# Patient Record
Sex: Female | Born: 1990 | Hispanic: Yes | Marital: Single | Smoking: Never smoker
Health system: Southern US, Community
[De-identification: ages and names within clinical notes are randomized; demographics above are authoritative.]

## PROBLEM LIST (undated history)

## (undated) ENCOUNTER — Inpatient Hospital Stay (HOSPITAL_COMMUNITY): Payer: Self-pay

## (undated) DIAGNOSIS — E781 Pure hyperglyceridemia: Secondary | ICD-10-CM

## (undated) DIAGNOSIS — E119 Type 2 diabetes mellitus without complications: Secondary | ICD-10-CM

## (undated) DIAGNOSIS — N994 Postprocedural pelvic peritoneal adhesions: Secondary | ICD-10-CM

## (undated) DIAGNOSIS — Z789 Other specified health status: Secondary | ICD-10-CM

## (undated) DIAGNOSIS — E785 Hyperlipidemia, unspecified: Secondary | ICD-10-CM

---

## 2014-02-08 ENCOUNTER — Encounter (HOSPITAL_COMMUNITY): Payer: Self-pay | Admitting: *Deleted

## 2014-02-08 ENCOUNTER — Inpatient Hospital Stay (HOSPITAL_COMMUNITY)
Admission: AD | Admit: 2014-02-08 | Discharge: 2014-02-08 | Disposition: A | Discharge status: Home / Self Care | Payer: Self-pay | Source: Ambulatory Visit | Attending: Obstetrics & Gynecology | Admitting: Obstetrics & Gynecology

## 2014-02-08 DIAGNOSIS — O99810 Abnormal glucose complicating pregnancy: Secondary | ICD-10-CM

## 2014-02-08 DIAGNOSIS — Z3493 Encounter for supervision of normal pregnancy, unspecified, third trimester: Secondary | ICD-10-CM

## 2014-02-08 DIAGNOSIS — O9989 Other specified diseases and conditions complicating pregnancy, childbirth and the puerperium: Secondary | ICD-10-CM | POA: Insufficient documentation

## 2014-02-08 DIAGNOSIS — Z98891 History of uterine scar from previous surgery: Secondary | ICD-10-CM

## 2014-02-08 DIAGNOSIS — O0933 Supervision of pregnancy with insufficient antenatal care, third trimester: Secondary | ICD-10-CM

## 2014-02-08 DIAGNOSIS — Z3A33 33 weeks gestation of pregnancy: Secondary | ICD-10-CM | POA: Insufficient documentation

## 2014-02-08 DIAGNOSIS — Z331 Pregnant state, incidental: Secondary | ICD-10-CM

## 2014-02-08 HISTORY — DX: Other specified health status: Z78.9

## 2014-02-08 HISTORY — DX: History of uterine scar from previous surgery: Z98.891

## 2014-02-08 LAB — COMPREHENSIVE METABOLIC PANEL
ALT: 9 U/L (ref 0–35)
AST: 13 U/L (ref 0–37)
Albumin: 2.7 g/dL — ABNORMAL LOW (ref 3.5–5.2)
Alkaline Phosphatase: 97 U/L (ref 39–117)
Anion gap: 11 (ref 5–15)
BILIRUBIN TOTAL: 0.3 mg/dL (ref 0.3–1.2)
BUN: 4 mg/dL — ABNORMAL LOW (ref 6–23)
CHLORIDE: 106 meq/L (ref 96–112)
CO2: 20 meq/L (ref 19–32)
CREATININE: 0.42 mg/dL — AB (ref 0.50–1.10)
Calcium: 8.7 mg/dL (ref 8.4–10.5)
GFR calc Af Amer: >90 mL/min (ref >90)
GFR calc non Af Amer: >90 mL/min (ref >90)
Glucose, Bld: 84 mg/dL (ref 70–99)
POTASSIUM: 3.5 meq/L — AB (ref 3.7–5.3)
SODIUM: 137 meq/L (ref 137–147)
Total Protein: 6.5 g/dL (ref 6.0–8.3)

## 2014-02-08 LAB — HIV ANTIBODY (ROUTINE TESTING W REFLEX): HIV: NONREACTIVE

## 2014-02-08 LAB — CBC
HEMATOCRIT: 31.7 % — AB (ref 36.0–46.0)
Hemoglobin: 10.5 g/dL — ABNORMAL LOW (ref 12.0–15.0)
MCH: 27.7 pg (ref 26.0–34.0)
MCHC: 33.1 g/dL (ref 30.0–36.0)
MCV: 83.6 fL (ref 78.0–100.0)
Platelets: 169 10*3/uL (ref 150–400)
RBC: 3.79 MIL/uL — ABNORMAL LOW (ref 3.87–5.11)
RDW: 13.2 % (ref 11.5–15.5)
WBC: 6.3 10*3/uL (ref 4.0–10.5)

## 2014-02-08 LAB — DIFFERENTIAL
BASOS ABS: 0 10*3/uL (ref 0.0–0.1)
Basophils Relative: 0 % (ref 0–1)
EOS PCT: 4 % (ref 0–5)
Eosinophils Absolute: 0.2 10*3/uL (ref 0.0–0.7)
LYMPHS PCT: 29 % (ref 12–46)
Lymphs Abs: 1.8 10*3/uL (ref 0.7–4.0)
MONO ABS: 0.3 10*3/uL (ref 0.1–1.0)
Monocytes Relative: 5 % (ref 3–12)
Neutro Abs: 4 10*3/uL (ref 1.7–7.7)
Neutrophils Relative %: 62 % (ref 43–77)

## 2014-02-08 LAB — ABO/RH: ABO/RH(D): O POS

## 2014-02-08 LAB — TYPE AND SCREEN
ABO/RH(D): O POS
Antibody Screen: NEGATIVE

## 2014-02-08 LAB — GLUCOSE TOLERANCE, 1 HOUR: GLUCOSE 1 HOUR GTT: 160 mg/dL — AB (ref 70–140)

## 2014-02-08 LAB — HEPATITIS B SURFACE ANTIGEN: Hepatitis B Surface Ag: NEGATIVE

## 2014-02-08 LAB — RPR

## 2014-02-08 LAB — PROTEIN / CREATININE RATIO, URINE
Creatinine, Urine: 45.22 mg/dL
Protein Creatinine Ratio: 0.17 — ABNORMAL HIGH (ref 0.00–0.15)
Total Protein, Urine: 7.8 mg/dL

## 2014-02-08 NOTE — MAU Note (Signed)
RN spoke with Lyla Sonarrie in [facility]. Patient outpatient ultrasound scheduled for Wednesday Oct 7th at 3:30.

## 2014-02-08 NOTE — Discharge Instructions (Signed)
Tercer trimestre de Media planner (Third Trimester of Pregnancy) El tercer trimestre va desde la semana29 hasta la 92, desde el sptimo hasta el noveno mes, y es la poca en la que el feto crece ms rpidamente. Hacia el final del noveno mes, el feto mide alrededor de 20pulgadas (45cm) de largo y pesa entre 6 y 68 libras (2,700 y 57,500kg).  CAMBIOS EN EL ORGANISMO Su organismo atraviesa por muchos cambios durante el [place], y estos varan de Ardelia Mems mujer a Theatre manager.   Seguir [employer]. Es de esperar que aumente entre 25 y 35libras (64 y 16kg) hacia el final del Media planner.  Podrn aparecer las primeras [employer] Corporation caderas, el abdomen y las [place].  Puede tener necesidad de Garment/textile technologist con ms frecuencia porque el feto baja hacia la pelvis y ejerce presin sobre la vejiga.  Debido al Glennis Brink podr sentir Victorio Palm estomacal con frecuencia.  Puede estar estreida, ya que ciertas hormonas enlentecen los movimientos de los msculos que [employer] desechos a travs de los intestinos.  Pueden aparecer hemorroides o abultarse e hincharse las venas (venas varicosas).  Puede sentir dolor plvico debido al [employer] y a que las hormonas del Scientist, research (life sciences) las articulaciones entre los huesos de la pelvis. El dolor de espalda puede ser consecuencia de la sobrecarga de los msculos que soportan la [place].  Tal vez haya cambios en el cabello que pueden incluir su engrosamiento, crecimiento rpido y cambios en la textura. Adems, a algunas mujeres se les cae el cabello durante o despus del embarazo, o tienen el cabello seco o fino. Lo ms probable es que el cabello se le normalice despus del nacimiento del beb.  Las [employer] seguirn creciendo y Teaching laboratory technician. A veces, puede haber una secrecin amarilla de las mamas llamada calostro.  El ombligo puede salir hacia afuera.  Puede sentir que le falta el aire debido a que se expande el tero.  Puede notar que el feto "baja" o lo siente ms bajo, en el  abdomen.  Puede tener una prdida de secrecin mucosa con sangre. Esto suele ocurrir en el trmino de unos pocos das a una semana antes de que comience el [place].  El cuello del tero se vuelve delgado y blando (se borra) cerca de la fecha de [place]. QU DEBE ESPERAR EN LOS EXMENES PRENATALES  Le harn exmenes prenatales cada 2semanas hasta la semana36. A partir de ese momento le harn exmenes semanales. Durante una visita prenatal de rutina:  La pesarn para asegurarse de que usted y el feto estn creciendo normalmente.  Le tomarn la presin arterial.  Le medirn el abdomen para controlar el desarrollo del beb.  Se escucharn los latidos cardacos fetales.  Se evaluarn los resultados de los estudios solicitados en visitas anteriores.  Le revisarn el cuello del tero cuando est prxima la fecha de parto para controlar si este se ha borrado. Alrededor de la semana36, el mdico le revisar el cuello del tero. Al mismo tiempo, realizar un anlisis de las secreciones del tejido vaginal. Este examen es para determinar si hay un tipo de bacteria, estreptococo Grupo B. El mdico le explicar esto con ms detalle. El mdico puede preguntarle lo siguiente:  Cmo le gustara que fuera el [place].  Cmo se siente.  Si siente los movimientos del beb.  Si ha tenido sntomas anormales, como prdida de lquido, [place], dolores de cabeza intensos o clicos abdominales.  Si tiene [employer]. Otros exmenes o estudios de deteccin que pueden realizarse  durante el tercer trimestre incluyen lo siguiente:  Anlisis de sangre para controlar las concentraciones de hierro (anemia).  Controles fetales para determinar su salud, nivel de [place] y Mining engineer. Si tiene [place] enfermedad o hay problemas durante el embarazo, le harn estudios. FALSO TRABAJO DE PARTO Es posible que sienta contracciones leves e irregulares que finalmente desaparecen. Se llaman contracciones de  Braxton Hicks o falso trabajo de Star. Las Yahoo pueden durar horas, das o incluso semanas, antes de que el verdadero trabajo de parto se inicie. Si las contracciones ocurren a intervalos regulares, se intensifican o se hacen dolorosas, lo mejor es que la revise el mdico.  SIGNOS DE TRABAJO DE PARTO   Clicos de tipo menstrual.  Contracciones cada 13minutos o menos.  Contracciones que comienzan en la parte superior del tero y se extienden hacia abajo, a la zona inferior del abdomen y la espalda.  Sensacin de mayor presin en la pelvis o dolor de espalda.  Una secrecin de mucosidad acuosa o con sangre que sale de la vagina. Si tiene alguno de estos signos antes de la [id number] del Media planner, llame a su mdico de inmediato. Debe concurrir al hospital para que la controlen inmediatamente. INSTRUCCIONES PARA EL CUIDADO EN EL HOGAR   Evite fumar, consumir hierbas, beber alcohol y tomar frmacos que no le hayan recetado. Estas sustancias qumicas afectan la formacin y el desarrollo del beb.  [place] mdico en relacin con el uso de medicamentos. Durante el embarazo, hay medicamentos que son seguros de tomar y otros que no.  Haga actividad fsica solo en la forma indicada por el mdico. Sentir clicos uterinos es un buen signo para Ambulance person actividad fsica.  Contine comiendo alimentos que sanos con regularidad.  Use un sostn que le brinde buen soporte si le [employer].  No se d baos de inmersin en agua caliente, baos turcos ni saunas.  Colquese el cinturn de seguridad cuando conduzca.  No coma carne cruda ni queso sin cocinar; evite el contacto con las bandejas sanitarias de los gatos y la tierra que estos animales usan. Estos elementos contienen grmenes que pueden causar defectos congnitos en el beb.  [place].  Si est estreida, pruebe un laxante suave (si el mdico lo autoriza). Consuma ms alimentos ricos en  fibra, como vegetales y frutas frescos y Psychologist, prison and probation services. Beba gran cantidad de lquido para mantener la orina de tono claro o color amarillo plido.  Dese baos de asiento con agua tibia para Best boy o las molestias causadas por las hemorroides. Use una crema para las hemorroides si el mdico la autoriza.  Si tiene venas varicosas, use medias de descanso. Eleve los pies durante 80minutos, 3 o 4veces por da. Limite la cantidad de sal en su dieta.  Evite levantar objetos pesados, use zapatos de tacones bajos y [place].  Descanse con las piernas elevadas si tiene calambres o dolor de cintura.  Visite a su dentista si no lo ha Quarry manager. Use un cepillo de dientes blando para higienizarse los dientes y psese el hilo dental con suavidad.  Puede seguir [employer], a menos que el mdico le indique lo contrario.  No haga viajes largos excepto que sea absolutamente necesario y solo con la autorizacin del [place] clases prenatales para Development worker, international aid, Psychologist, prison and probation services y hacer preguntas sobre el [place] de parto y [place].  Haga un ensayo de la partida al hospital.  Prepare el bolso que  llevar al hospital.  Prepare la habitacin del beb.  Concurra a todas las visitas prenatales segn las indicaciones de su mdico. SOLICITE ATENCIN MDICA SI:  No est segura de que est en trabajo de parto o de que ha roto la bolsa de las aguas.  Tiene mareos.  Siente clicos leves, presin en la pelvis o dolor persistente en el abdomen.  Tiene nuseas, vmitos o diarrea persistentes.  Tiene secrecin vaginal con mal olor.  Siente dolor al ConocoPhillipsorinar. SOLICITE ATENCIN MDICA DE INMEDIATO SI:   Tiene fiebre.  Tiene una prdida de lquido por la vagina.  Tiene sangrado o pequeas prdidas vaginales.  Siente dolor intenso o clicos en el abdomen.  Sube o baja de peso rpidamente.  Tiene dificultad para respirar y siente dolor de  pecho.  Sbitamente se le hinchan mucho el rostro, las [place], los tobillos, los pies o las piernas.  No ha sentido los movimientos del beb durante Georgianne Fickuna hora.  Siente un dolor de cabeza intenso que no se alivia con medicamentos.  Hay cambios en la visin. Document Released: 02/03/2005 Document Revised: 05/01/2013 [facility] Patient Information 2015 [place], [place]. This information is not intended to replace advice given to you by your health care provider. Make sure you discuss any questions you have with your health care provider.   Evaluacin de los movimientos fetales  (Fetal Movement Counts) Nombre del paciente: __________________________________________________ Shelia ChapmanFecha de parto estimada: ____________________ Shelia HammanLa evaluacin de los movimientos fetales es muy recomendable en los embarazos de alto riesgo, pero tambin es una buena idea que lo hagan todas las Grass Valleyembarazadas. El Firefightermdico le indicar que comience a contarlos a las 28 semanas de Heavenerembarazo. Los movimientos fetales suelen aumentar:   Despus de Animatoruna comida completa.  Despus de la actividad fsica.  Despus de comer o beber Graybar Electricalgo dulce o fro.  En reposo. Preste atencin cuando sienta que el beb est ms activo. Esto le ayudar a notar un patrn de ciclos de vigilia y sueo de su beb y cules son los factores que contribuyen a un aumento de los movimientos fetales. Es importante llevar a cabo un recuento de movimientos fetales, al mismo tiempo cada da, cuando el beb normalmente est ms activo.  CMO CONTAR LOS MOVIMIENTOS FETALES 1. Busque un lugar tranquilo y cmodo para sentarse o recostarse sobre el lado izquierdo. Al recostarse sobre su lado izquierdo, le proporciona una mejor circulacin de [place] Branchsangre y oxgeno al beb. 2. Anote el da y la hora en una hoja de papel o en un diario. 3. Comience contando las pataditas, revoloteos, chasquidos, vueltas o pinchazos en un perodo de 2 horas. Debe sentir al menos 10 movimientos en 2  horas. 4. Si no siente 10 movimientos en 2 horas, espere 2  3 horas y cuente de nuevo. Busque cambios en el patrn o si no cuenta lo suficiente en 2 horas. SOLICITE ATENCIN MDICA SI:   Siente menos de 10 pataditas en 2 horas, en dos intentos.  No hay movimientos durante una hora.  El patrn se modifica o le lleva ms tiempo Art gallery managercada da contar las 10 pataditas.  Siente que el beb no se mueve como lo hace habitualmente.

## 2014-02-08 NOTE — MAU Note (Signed)
Pt seen @ MD office this a.m., found out she was pregnant today - note with pt from MD states she has fundal height of 32 cm's & pos FHT's.  [facility]  Denies pain, bleeding or LOF.  Pt states she had numerous neg HPT's, then pos HPT a month ago.

## 2014-02-08 NOTE — MAU Provider Note (Signed)
History     CSN: [id number]  Arrival date and time: 02/08/14 1038   First Provider Initiated Contact with Patient 02/08/14 1131      Chief complaint: Positive pregnancy test  HPI  Idelia Caudell is a 23 y.o. G4P3 at [redacted]w[redacted]d by LMP= 06/16/2013 who presents with no prenatal care and a positive pregnancy test this morning. Patient reports initially presenting to the [facility]" where she had a positive pregnancy test and was told to come to [facility]. All of her previous pregnancies were managed in [place] all reportedly delivered via c/s due to fetal macrosomia. Denies any complications with those pregnancies. Patient denies headaches, blurred vision, and abdominal pain. No history of hypertension or diabetes.   She denies contractions, loss of fluid, and vaginal bleeding. Reports good fetal movements.   OB History   Grav Para Term Preterm Abortions TAB SAB Ect Mult Living   4 3        3       Past Medical History  Diagnosis Date  . Medical history non-contributory     Past Surgical History  Procedure Laterality Date  . Cesarean section      C/S x 3    History reviewed. No pertinent family history.  History  Substance Use Topics  . Smoking status: Never Smoker   . Smokeless tobacco: Not on file  . Alcohol Use: No    Allergies: Allergies not on file  No prescriptions prior to admission    Review of Systems  All other systems reviewed and are negative.  Physical Exam   Blood pressure 121/73, pulse 72, temperature 98.4 F (36.9 C), temperature source Oral, resp. rate 18, last menstrual period 06/16/2013.  Physical Exam  Nursing note and vitals reviewed. Constitutional: She is oriented to person, place, and time. She appears well-developed. No distress.  HENT:  Head: Normocephalic and atraumatic.  Neck: Normal range of motion.  Cardiovascular: Normal rate and regular rhythm.   No murmur heard. Respiratory: Effort normal and breath sounds normal.  She has no wheezes.  GI: Soft. There is no tenderness.  Musculoskeletal: Normal range of motion. She exhibits no edema.  Neurological: She is alert and oriented to person, place, and time. She displays normal reflexes.  Skin: Skin is warm and dry.   FHT:  FHR: 145 bpm, variability: Moderate,  accelerations:  present,  decelerations:  none  Procedures  Results for orders placed during the hospital encounter of 02/08/14 (from the past 24 hour(s))  PROTEIN / CREATININE RATIO, URINE     Status: Abnormal   Collection Time    02/08/14 10:55 AM      Result Value Ref Range   Creatinine, Urine 45.22     Total Protein, Urine 7.8     Protein Creatinine Ratio 0.17 (*) 0.00 - 0.15  CBC     Status: Abnormal   Collection Time    02/08/14 11:56 AM      Result Value Ref Range   WBC 6.3  4.0 - 10.5 K/uL   RBC 3.79 (*) 3.87 - 5.11 MIL/uL   Hemoglobin 10.5 (*) 12.0 - 15.0 g/dL   HCT 40.9 (*) 81.1 - 91.4 %   MCV 83.6  78.0 - 100.0 fL   MCH 27.7  26.0 - 34.0 pg   MCHC 33.1  30.0 - 36.0 g/dL   RDW 78.2  95.6 - 21.3 %   Platelets 169  150 - 400 K/uL  DIFFERENTIAL     Status: None  Collection Time    02/08/14 11:56 AM      Result Value Ref Range   Neutrophils Relative % 62  43 - 77 %   Neutro Abs 4.0  1.7 - 7.7 K/uL   Lymphocytes Relative 29  12 - 46 %   Lymphs Abs 1.8  0.7 - 4.0 K/uL   Monocytes Relative 5  3 - 12 %   Monocytes Absolute 0.3  0.1 - 1.0 K/uL   Eosinophils Relative 4  0 - 5 %   Eosinophils Absolute 0.2  0.0 - 0.7 K/uL   Basophils Relative 0  0 - 1 %   Basophils Absolute 0.0  0.0 - 0.1 K/uL  COMPREHENSIVE METABOLIC PANEL     Status: Abnormal   Collection Time    02/08/14 11:56 AM      Result Value Ref Range   Sodium 137  137 - 147 mEq/L   Potassium 3.5 (*) 3.7 - 5.3 mEq/L   Chloride 106  96 - 112 mEq/L   CO2 20  19 - 32 mEq/L   Glucose, Bld 84  70 - 99 mg/dL   BUN 4 (*) 6 - 23 mg/dL   Creatinine, Ser 3.080.42 (*) 0.50 - 1.10 mg/dL   Calcium 8.7  8.4 - 65.710.5 mg/dL   Total  Protein 6.5  6.0 - 8.3 g/dL   Albumin 2.7 (*) 3.5 - 5.2 g/dL   AST 13  0 - 37 U/L   ALT 9  0 - 35 U/L   Alkaline Phosphatase 97  39 - 117 U/L   Total Bilirubin 0.3  0.3 - 1.2 mg/dL   GFR calc non Af Amer >90  >90 mL/min   GFR calc Af Amer >90  >90 mL/min   Anion gap 11  5 - 15      Assessment and Plan   A: 23 yo G4P3 @ 1463w6d by LMP presents to establish prenatal care. No current complaints or complications.  P:  Labs drawn: CBC w/ diff, T&S, HBV, HIV, RPR, Rubella, CMP Ordered anatomy scan Labor precautions and kick counts reviewed Will follow up with health department  Jacquiline DoeParker, Caleb 02/08/2014, 1:06 PM   Addendum: I saw pt. Will  get 1 hr GCT today and schedule next available US (done); S=D. [facility].  RF: previous C/S x3, hx large babies ([place] EFM: Category 1 FHR tracing Eda here for interpretation   Danae OrleansDeirdre C Poe, CNM 02/08/2014 2:17 PM

## 2014-02-09 LAB — RUBELLA SCREEN: Rubella: 2.14 Index — ABNORMAL HIGH (ref <0.90)

## 2014-02-13 ENCOUNTER — Ambulatory Visit (HOSPITAL_COMMUNITY)
Admit: 2014-02-13 | Discharge: 2014-02-13 | Disposition: A | Discharge status: Home / Self Care | Payer: Self-pay | Attending: Obstetrics & Gynecology | Admitting: Obstetrics & Gynecology

## 2014-02-13 DIAGNOSIS — Z3689 Encounter for other specified antenatal screening: Secondary | ICD-10-CM

## 2014-02-13 DIAGNOSIS — Z3A34 34 weeks gestation of pregnancy: Secondary | ICD-10-CM

## 2014-02-13 DIAGNOSIS — O0933 Supervision of pregnancy with insufficient antenatal care, third trimester: Secondary | ICD-10-CM

## 2014-02-13 DIAGNOSIS — Z3493 Encounter for supervision of normal pregnancy, unspecified, third trimester: Secondary | ICD-10-CM

## 2014-02-21 ENCOUNTER — Encounter: Payer: Self-pay | Admitting: Family Medicine

## 2014-03-05 ENCOUNTER — Encounter: Payer: Self-pay | Admitting: General Practice

## 2014-03-08 ENCOUNTER — Encounter: Payer: Self-pay | Admitting: General Practice

## 2014-03-12 ENCOUNTER — Encounter (HOSPITAL_COMMUNITY): Payer: Self-pay | Admitting: *Deleted

## 2014-03-22 ENCOUNTER — Encounter (HOSPITAL_COMMUNITY)
Admission: AD | Disposition: A | Discharge status: Home / Self Care | Payer: Self-pay | Source: Ambulatory Visit | Attending: Obstetrics and Gynecology

## 2014-03-22 ENCOUNTER — Inpatient Hospital Stay (HOSPITAL_COMMUNITY): Payer: Medicaid Other | Admitting: Anesthesiology

## 2014-03-22 ENCOUNTER — Inpatient Hospital Stay (HOSPITAL_COMMUNITY)
Admission: AD | Admit: 2014-03-22 | Discharge: 2014-03-25 | DRG: 766 | Disposition: A | Discharge status: Home / Self Care | Payer: Medicaid Other | Source: Ambulatory Visit | Attending: Obstetrics and Gynecology | Admitting: Obstetrics and Gynecology

## 2014-03-22 ENCOUNTER — Encounter (HOSPITAL_COMMUNITY): Payer: Self-pay | Admitting: *Deleted

## 2014-03-22 DIAGNOSIS — N736 Female pelvic peritoneal adhesions (postinfective): Secondary | ICD-10-CM | POA: Diagnosis present

## 2014-03-22 DIAGNOSIS — O3421 Maternal care for scar from previous cesarean delivery: Secondary | ICD-10-CM | POA: Diagnosis not present

## 2014-03-22 DIAGNOSIS — Z3A39 39 weeks gestation of pregnancy: Secondary | ICD-10-CM | POA: Diagnosis present

## 2014-03-22 DIAGNOSIS — Z3483 Encounter for supervision of other normal pregnancy, third trimester: Secondary | ICD-10-CM | POA: Diagnosis present

## 2014-03-22 DIAGNOSIS — Z98891 History of uterine scar from previous surgery: Secondary | ICD-10-CM

## 2014-03-22 HISTORY — DX: Postprocedural pelvic peritoneal adhesions: N99.4

## 2014-03-22 LAB — CBC
HEMATOCRIT: 35.1 % — AB (ref 36.0–46.0)
HEMOGLOBIN: 11.9 g/dL — AB (ref 12.0–15.0)
MCH: 27.6 pg (ref 26.0–34.0)
MCHC: 33.9 g/dL (ref 30.0–36.0)
MCV: 81.4 fL (ref 78.0–100.0)
Platelets: 190 10*3/uL (ref 150–400)
RBC: 4.31 MIL/uL (ref 3.87–5.11)
RDW: 14 % (ref 11.5–15.5)
WBC: 8.7 10*3/uL (ref 4.0–10.5)

## 2014-03-22 LAB — TYPE AND SCREEN
ABO/RH(D): O POS
ANTIBODY SCREEN: NEGATIVE

## 2014-03-22 LAB — RPR

## 2014-03-22 SURGERY — Surgical Case
Anesthesia: Spinal | Site: Abdomen

## 2014-03-22 MED ORDER — PHENYLEPHRINE 8 MG IN D5W 100 ML (0.08MG/ML) PREMIX OPTIME
INJECTION | INTRAVENOUS | Status: DC | PRN
  Administered 2014-03-22: 60 ug/min via INTRAVENOUS

## 2014-03-22 MED ORDER — LACTATED RINGERS IV SOLN
INTRAVENOUS | Status: DC
  Administered 2014-03-22 (×5): via INTRAVENOUS

## 2014-03-22 MED ORDER — OXYTOCIN 10 UNIT/ML IJ SOLN
40.0000 [IU] | INTRAVENOUS | Status: DC | PRN
  Administered 2014-03-22: 40 [IU] via INTRAVENOUS

## 2014-03-22 MED ORDER — FENTANYL CITRATE 0.05 MG/ML IJ SOLN
INTRAMUSCULAR | Status: DC | PRN
  Administered 2014-03-22: 150 ug via EPIDURAL

## 2014-03-22 MED ORDER — FENTANYL CITRATE 0.05 MG/ML IJ SOLN
INTRAMUSCULAR | Status: AC
  Filled 2014-03-22: qty 2

## 2014-03-22 MED ORDER — LACTATED RINGERS IV BOLUS (SEPSIS): 1000.0000 mL | Freq: Once | INTRAVENOUS | Status: DC

## 2014-03-22 MED ORDER — CHLOROPROCAINE HCL 3 % IJ SOLN: INTRAMUSCULAR | Status: DC | PRN

## 2014-03-22 MED ORDER — SODIUM BICARBONATE 8.4 % IV SOLN
INTRAVENOUS | Status: AC
  Filled 2014-03-22: qty 50

## 2014-03-22 MED ORDER — PHENYLEPHRINE 8 MG IN D5W 100 ML (0.08MG/ML) PREMIX OPTIME
INJECTION | INTRAVENOUS | Status: AC
  Filled 2014-03-22: qty 100

## 2014-03-22 MED ORDER — TERBUTALINE SULFATE 1 MG/ML IJ SOLN
0.2500 mg | Freq: Once | INTRAMUSCULAR | Status: AC
  Administered 2014-03-22: 0.25 mg via SUBCUTANEOUS
  Filled 2014-03-22: qty 1

## 2014-03-22 MED ORDER — SCOPOLAMINE 1 MG/3DAYS TD PT72
MEDICATED_PATCH | TRANSDERMAL | Status: AC
  Filled 2014-03-22: qty 1

## 2014-03-22 MED ORDER — CEFAZOLIN SODIUM-DEXTROSE 2-3 GM-% IV SOLR
2.0000 g | INTRAVENOUS | Status: AC
  Administered 2014-03-22: 2 g via INTRAVENOUS
  Filled 2014-03-22: qty 50

## 2014-03-22 MED ORDER — FAMOTIDINE IN NACL 20-0.9 MG/50ML-% IV SOLN
20.0000 mg | Freq: Once | INTRAVENOUS | Status: AC
  Administered 2014-03-22: 20 mg via INTRAVENOUS
  Filled 2014-03-22: qty 50

## 2014-03-22 MED ORDER — LIDOCAINE-EPINEPHRINE (PF) 2 %-1:200000 IJ SOLN
INTRAMUSCULAR | Status: AC
  Filled 2014-03-22: qty 20

## 2014-03-22 MED ORDER — OXYTOCIN 10 UNIT/ML IJ SOLN
INTRAMUSCULAR | Status: AC
  Filled 2014-03-22: qty 4

## 2014-03-22 MED ORDER — MORPHINE SULFATE 0.5 MG/ML IJ SOLN
INTRAMUSCULAR | Status: AC
  Filled 2014-03-22: qty 10

## 2014-03-22 MED ORDER — CHLOROPROCAINE HCL 3 % IJ SOLN
INTRAMUSCULAR | Status: AC
  Filled 2014-03-22: qty 20

## 2014-03-22 MED ORDER — DEXAMETHASONE SODIUM PHOSPHATE 4 MG/ML IJ SOLN
INTRAMUSCULAR | Status: AC
  Filled 2014-03-22: qty 1

## 2014-03-22 MED ORDER — CITRIC ACID-SODIUM CITRATE 334-500 MG/5ML PO SOLN
30.0000 mL | Freq: Once | ORAL | Status: AC
  Administered 2014-03-22: 30 mL via ORAL
  Filled 2014-03-22: qty 15

## 2014-03-22 MED ORDER — ONDANSETRON HCL 4 MG/2ML IJ SOLN
INTRAMUSCULAR | Status: AC
  Filled 2014-03-22: qty 2

## 2014-03-22 MED ORDER — PROPOFOL 10 MG/ML IV EMUL
INTRAVENOUS | Status: AC
  Filled 2014-03-22: qty 20

## 2014-03-22 MED ORDER — LIDOCAINE-EPINEPHRINE (PF) 2 %-1:200000 IJ SOLN
INTRAMUSCULAR | Status: DC | PRN
  Administered 2014-03-22: 5 mL via INTRADERMAL
  Administered 2014-03-22: 2 mL via INTRADERMAL
  Administered 2014-03-22 (×2): 5 mL via INTRADERMAL
  Administered 2014-03-22: 3 mL via INTRADERMAL
  Administered 2014-03-22 (×2): 5 mL via INTRADERMAL
  Administered 2014-03-22: 3 mL via INTRADERMAL
  Administered 2014-03-22: 5 mL via INTRADERMAL
  Administered 2014-03-22: 2 mL via INTRADERMAL

## 2014-03-22 SURGICAL SUPPLY — 36 items
BENZOIN TINCTURE PRP APPL 2/3 (GAUZE/BANDAGES/DRESSINGS) ×3 IMPLANT
CLAMP CORD UMBIL (MISCELLANEOUS) IMPLANT
CLOSURE WOUND 1/2 X4 (GAUZE/BANDAGES/DRESSINGS) ×1
CLOTH BEACON ORANGE TIMEOUT ST (SAFETY) ×3 IMPLANT
DRAIN JACKSON PRT FLT 10 (DRAIN) ×3 IMPLANT
DRAPE SHEET LG 3/4 BI-LAMINATE (DRAPES) IMPLANT
DRSG OPSITE POSTOP 4X10 (GAUZE/BANDAGES/DRESSINGS) ×3 IMPLANT
DURAPREP 26ML APPLICATOR (WOUND CARE) ×3 IMPLANT
ELECT REM PT RETURN 9FT ADLT (ELECTROSURGICAL) ×3
ELECTRODE REM PT RTRN 9FT ADLT (ELECTROSURGICAL) ×1 IMPLANT
EVACUATOR SILICONE 100CC (DRAIN) ×3 IMPLANT
EXTRACTOR VACUUM KIWI (MISCELLANEOUS) ×3 IMPLANT
GLOVE BIO SURGEON ST LM GN SZ9 (GLOVE) ×3 IMPLANT
GLOVE BIOGEL PI IND STRL 9 (GLOVE) ×1 IMPLANT
GLOVE BIOGEL PI INDICATOR 9 (GLOVE) ×2
GOWN STRL REUS W/TWL 2XL LVL3 (GOWN DISPOSABLE) ×3 IMPLANT
GOWN STRL REUS W/TWL LRG LVL3 (GOWN DISPOSABLE) ×3 IMPLANT
NEEDLE HYPO 25X5/8 SAFETYGLIDE (NEEDLE) IMPLANT
NS IRRIG 1000ML POUR BTL (IV SOLUTION) ×3 IMPLANT
PACK C SECTION WH (CUSTOM PROCEDURE TRAY) ×3 IMPLANT
PAD OB MATERNITY 4.3X12.25 (PERSONAL CARE ITEMS) ×3 IMPLANT
RTRCTR C-SECT PINK 25CM LRG (MISCELLANEOUS) ×3 IMPLANT
RTRCTR C-SECT PINK 34CM XLRG (MISCELLANEOUS) IMPLANT
STRIP CLOSURE SKIN 1/2X4 (GAUZE/BANDAGES/DRESSINGS) ×2 IMPLANT
SUT MNCRL 0 VIOLET CTX 36 (SUTURE) ×4 IMPLANT
SUT MNCRL AB 3-0 PS2 27 (SUTURE) ×3 IMPLANT
SUT MONOCRYL 0 CTX 36 (SUTURE) ×8
SUT VIC AB 0 CT1 27 (SUTURE) ×4
SUT VIC AB 0 CT1 27XBRD ANBCTR (SUTURE) ×2 IMPLANT
SUT VIC AB 2-0 CT1 27 (SUTURE) ×4
SUT VIC AB 2-0 CT1 TAPERPNT 27 (SUTURE) ×2 IMPLANT
SUT VIC AB 4-0 KS 27 (SUTURE) ×3 IMPLANT
SYR BULB IRRIGATION 50ML (SYRINGE) IMPLANT
TOWEL OR 17X24 6PK STRL BLUE (TOWEL DISPOSABLE) ×3 IMPLANT
TRAY FOLEY CATH 14FR (SET/KITS/TRAYS/PACK) ×3 IMPLANT
WATER STERILE IRR 1000ML POUR (IV SOLUTION) IMPLANT

## 2014-03-22 NOTE — Anesthesia Preprocedure Evaluation (Signed)

## 2014-03-22 NOTE — MAU Note (Signed)
uc's started around 1200, denies LOF, has small amount bloody show, good FM reported.

## 2014-03-22 NOTE — Consult Note (Signed)
Neonatology Note:   Attendance at C-section:   I was asked by Dr. Ferguson to attend this repeat C/S at term, currently in labor. The mother is a G4P3 O pos, GBS unknown with no PNC. ROM at delivery, fluid clear. Infant vigorous with good spontaneous cry and tone. Needed no suctioning. Ap 9/9. Lungs clear to ausc in DR. To CN to care of Pediatrician.  Shelia Ford C. Dejon Lukas, MD 

## 2014-03-22 NOTE — MAU Provider Note (Signed)
  History     CSN: [id number]  Arrival date and time: 03/22/14 1724   None     Chief Complaint  Patient presents with  . Labor Eval   HPI 23 year old Hispanic female gravida 4 para 3003, prior cesarean section 3, presents to the MAU for complaints of abdominal contractions, with fetal monitoring showing mild uterine contractions every 4-5 minutes of mild intensity tolerated well by the patient. She is 39 weeks 6 days by LMP and confirmed by an ultrasound in the early third trimester, 34 weeks, which correlated within 2 days. Has been no bleeding or rupture membranes. The patient understands that we would recommend cesarean delivery based on the history of multiple prior cesareans. She does not desire sterilization. Future birth control is planned as Depo-Provera. Her last meal was 12 noon, she'll be eligible for cesarean section at 8 PM. Postsurgical procedure risks and benefits and or recommending cesarean have been discussed with the patient through translator. Patient's husband is out of town and will not be available to us. The cesarean  Pertinent Gynecological History: Menses: pregnant Bleeding: none Contraception: Depo-Provera injectionsplanned DES exposure: unknown Blood transfusions: none Sexually transmitted diseases: no past history Previous GYN Procedures: cesarean section 3  Last mammogram: Date:  Last pap:  Date:    Past Medical History  Diagnosis Date  . Medical history non-contributory     Past Surgical History  Procedure Laterality Date  . Cesarean section      C/S x 3    History reviewed. No pertinent family history.  History  Substance Use Topics  . Smoking status: Never Smoker   . Smokeless tobacco: Not on file  . Alcohol Use: No    Allergies: No Known Allergies  No prescriptions prior to admission    ROS good fetal movement, no acute pain at this time, contractions noted mild intensity Physical Exam   Blood pressure 132/85, pulse 63,  temperature 97.7 F (36.5 C), temperature source Oral, resp. rate 20, last menstrual period 06/16/2013.  Physical Exam  Constitutional: She appears well-developed and well-nourished.  HENT:  Head: Normocephalic and atraumatic.  Eyes: Pupils are equal, round, and reactive to light.  Neck: Normal range of motion. Neck supple.  Cardiovascular: Normal rate.   Respiratory: Effort normal and breath sounds normal.  GI: Soft.  Gravid uterus consistent with dates. Previous lower abdominal transverse incision  cervix checked by nurse at 1 cm  MAU Course  Procedures  MDM Assessment a records counseling through translator, and informed consent of procedure  Assessment and Plan  Pregnancy 39 weeks 6 days prior cesarean section 3, latent phase labor, scheduled for repeat cesarean section when Npo by mouth 8 hours Consent signed after explanation of the translator Vidit Boissonneault V 03/22/2014, 6:52 PM

## 2014-03-22 NOTE — H&P (Signed)
Tilda BurrowJohn Derak Schurman V, MD Physician Signed Obstetrics MAU Provider Note 03/22/2014 6:52 PM    Expand All Collapse All    History     CSN: [id number]  Arrival date and time: 03/22/14 1724  None    Chief Complaint  Patient presents with  . Labor Eval   HPI 23 year old Hispanic female gravida 4 para 3003, prior cesarean section 3, presents to the MAU for complaints of abdominal contractions, with fetal monitoring showing mild uterine contractions every 4-5 minutes of mild intensity tolerated well by the patient. She is 39 weeks 6 days by LMP and confirmed by an ultrasound in the early third trimester, 34 weeks, which correlated within 2 days. Has been no bleeding or rupture membranes. The patient understands that we would recommend cesarean delivery based on the history of multiple prior cesareans. She does not desire sterilization. Future birth control is planned as Depo-Provera. Her last meal was 12 noon, she'll be eligible for cesarean section at 8 PM. Postsurgical procedure risks and benefits and or recommending cesarean have been discussed with the patient through translator. Patient's husband is out of town and will not be available to us. The cesarean  Pertinent Gynecological History: Menses: pregnant Bleeding: none Contraception: Depo-Provera injectionsplanned DES exposure: unknown Blood transfusions: none Sexually transmitted diseases: no past history Previous GYN Procedures: cesarean section 3  Last mammogram: Date:  Last pap: Date:    Past Medical History  Diagnosis Date  . Medical history non-contributory     Past Surgical History  Procedure Laterality Date  . Cesarean section      C/S x 3    History reviewed. No pertinent family history.  History  Substance Use Topics  . Smoking status: Never Smoker   . Smokeless tobacco: Not on file  . Alcohol Use: No    Allergies: No Known Allergies  No prescriptions  prior to admission    ROS good fetal movement, no acute pain at this time, contractions noted mild intensity Physical Exam   Blood pressure 132/85, pulse 63, temperature 97.7 F (36.5 C), temperature source Oral, resp. rate 20, last menstrual period 06/16/2013.  Physical Exam  Constitutional: She appears well-developed and well-nourished.  HENT:  Head: Normocephalic and atraumatic.  Eyes: Pupils are equal, round, and reactive to light.  Neck: Normal range of motion. Neck supple.  Cardiovascular: Normal rate.  Respiratory: Effort normal and breath sounds normal.  GI: Soft.  Gravid uterus consistent with dates. Previous lower abdominal transverse incision  cervix checked by nurse at 1 cm  CBCfrom 03/22/2014    Component Value Date/Time   WBC 8.7 03/22/2014 1840   RBC 4.31 03/22/2014 1840   HGB 11.9* 03/22/2014 1840   HCT 35.1* 03/22/2014 1840   PLT 190 03/22/2014 1840   MCV 81.4 03/22/2014 1840   MCH 27.6 03/22/2014 1840   MCHC 33.9 03/22/2014 1840   RDW 14.0 03/22/2014 1840   LYMPHSABS 1.8 02/08/2014 1156   MONOABS 0.3 02/08/2014 1156   EOSABS 0.2 02/08/2014 1156   BASOSABS 0.0 02/08/2014 1156   Blood type O positive, type and screen ordered  MAU Course  Procedures  MDM Assessment a records counseling through translator, and informed consent of procedure  Assessment and Plan  Pregnancy 39 weeks 6 days prior cesarean section 3, latent phase labor, scheduled for repeat cesarean section when Npo by mouth 8 hours Consent signed after explanation of the translator Tilda BurrowFERGUSON,Sharian Delia V 03/22/2014, 6:52 PM

## 2014-03-22 NOTE — Anesthesia Procedure Notes (Signed)
Epidural Patient location during procedure: OB  Preanesthetic Checklist Completed: patient identified, site marked, surgical consent, pre-op evaluation, timeout performed, IV checked, risks and benefits discussed and monitors and equipment checked  Epidural Patient position: sitting Prep: site prepped and draped and DuraPrep Patient monitoring: continuous pulse ox and blood pressure Approach: midline Location: L3-L4 Injection technique: LOR air  Needle:  Needle type: Tuohy  Needle gauge: 17 G Needle length: 9 cm and 9 Needle insertion depth: 6 cm Catheter type: closed end flexible Catheter size: 19 Gauge Catheter at skin depth: 12 cm Test dose: negative  Assessment Events: blood not aspirated, injection not painful, no injection resistance, negative IV test and no paresthesia  Additional Notes Attempted SAB. No CSF with sprotte or sprotte thru tuohy. Switch to epidural, catheter passed easily

## 2014-03-23 ENCOUNTER — Encounter (HOSPITAL_COMMUNITY): Payer: Self-pay | Admitting: *Deleted

## 2014-03-23 DIAGNOSIS — Z98891 History of uterine scar from previous surgery: Secondary | ICD-10-CM

## 2014-03-23 LAB — CBC
HEMATOCRIT: 25.9 % — AB (ref 36.0–46.0)
HEMOGLOBIN: 8.6 g/dL — AB (ref 12.0–15.0)
MCH: 27.3 pg (ref 26.0–34.0)
MCHC: 33.6 g/dL (ref 30.0–36.0)
MCV: 81.2 fL (ref 78.0–100.0)
Platelets: 167 10*3/uL (ref 150–400)
RBC: 3.19 MIL/uL — AB (ref 3.87–5.11)
RDW: 14.3 % (ref 11.5–15.5)
WBC: 10.4 10*3/uL (ref 4.0–10.5)

## 2014-03-23 MED ORDER — MORPHINE SULFATE (PF) 0.5 MG/ML IJ SOLN
INTRAMUSCULAR | Status: DC | PRN
  Administered 2014-03-23: 1 mg via INTRAVENOUS
  Administered 2014-03-23: 4 mg via EPIDURAL

## 2014-03-23 MED ORDER — DIBUCAINE 1 % RE OINT: 1.0000 "application " | TOPICAL_OINTMENT | RECTAL | Status: DC | PRN

## 2014-03-23 MED ORDER — DIPHENHYDRAMINE HCL 25 MG PO CAPS: 25.0000 mg | ORAL_CAPSULE | ORAL | Status: DC | PRN

## 2014-03-23 MED ORDER — ONDANSETRON HCL 4 MG/2ML IJ SOLN
INTRAMUSCULAR | Status: DC | PRN
  Administered 2014-03-23: 4 mg via INTRAVENOUS

## 2014-03-23 MED ORDER — PRENATAL MULTIVITAMIN CH
1.0000 | ORAL_TABLET | Freq: Every day | ORAL | Status: DC
  Administered 2014-03-23 – 2014-03-25 (×3): 1 via ORAL
  Filled 2014-03-23 (×4): qty 1

## 2014-03-23 MED ORDER — OXYTOCIN 40 UNITS IN LACTATED RINGERS INFUSION - SIMPLE MED: 62.5000 mL/h | INTRAVENOUS | Status: AC

## 2014-03-23 MED ORDER — IBUPROFEN 600 MG PO TABS
600.0000 mg | ORAL_TABLET | Freq: Four times a day (QID) | ORAL | Status: DC
  Administered 2014-03-23 – 2014-03-25 (×9): 600 mg via ORAL
  Filled 2014-03-23 (×9): qty 1

## 2014-03-23 MED ORDER — ONDANSETRON HCL 4 MG/2ML IJ SOLN: 4.0000 mg | Freq: Three times a day (TID) | INTRAMUSCULAR | Status: DC | PRN

## 2014-03-23 MED ORDER — HYDROMORPHONE HCL 1 MG/ML IJ SOLN: 0.2500 mg | INTRAMUSCULAR | Status: DC | PRN

## 2014-03-23 MED ORDER — NALBUPHINE HCL 10 MG/ML IJ SOLN
5.0000 mg | INTRAMUSCULAR | Status: DC | PRN
  Filled 2014-03-23: qty 1

## 2014-03-23 MED ORDER — SCOPOLAMINE 1 MG/3DAYS TD PT72
MEDICATED_PATCH | TRANSDERMAL | Status: DC | PRN
  Administered 2014-03-23: 1 via TRANSDERMAL

## 2014-03-23 MED ORDER — ONDANSETRON HCL 4 MG/2ML IJ SOLN: 4.0000 mg | INTRAMUSCULAR | Status: DC | PRN

## 2014-03-23 MED ORDER — SIMETHICONE 80 MG PO CHEW
80.0000 mg | CHEWABLE_TABLET | Freq: Three times a day (TID) | ORAL | Status: DC
  Administered 2014-03-23 – 2014-03-25 (×6): 80 mg via ORAL
  Filled 2014-03-23 (×7): qty 1

## 2014-03-23 MED ORDER — NALBUPHINE HCL 10 MG/ML IJ SOLN: 5.0000 mg | INTRAMUSCULAR | Status: DC | PRN

## 2014-03-23 MED ORDER — MENTHOL 3 MG MT LOZG: 1.0000 | LOZENGE | OROMUCOSAL | Status: DC | PRN

## 2014-03-23 MED ORDER — DEXAMETHASONE SODIUM PHOSPHATE 4 MG/ML IJ SOLN
INTRAMUSCULAR | Status: DC | PRN
  Administered 2014-03-23: 4 mg via INTRAVENOUS

## 2014-03-23 MED ORDER — ONDANSETRON HCL 4 MG PO TABS: 4.0000 mg | ORAL_TABLET | ORAL | Status: DC | PRN

## 2014-03-23 MED ORDER — KETOROLAC TROMETHAMINE 30 MG/ML IJ SOLN
30.0000 mg | Freq: Four times a day (QID) | INTRAMUSCULAR | Status: DC | PRN
  Administered 2014-03-23: 30 mg via INTRAVENOUS

## 2014-03-23 MED ORDER — LIDOCAINE-EPINEPHRINE (PF) 2 %-1:200000 IJ SOLN
INTRAMUSCULAR | Status: AC
  Filled 2014-03-23: qty 20

## 2014-03-23 MED ORDER — MEPERIDINE HCL 25 MG/ML IJ SOLN
6.2500 mg | INTRAMUSCULAR | Status: DC | PRN
Start: 2014-03-23 — End: 2014-03-23

## 2014-03-23 MED ORDER — SIMETHICONE 80 MG PO CHEW: 80.0000 mg | CHEWABLE_TABLET | ORAL | Status: DC | PRN

## 2014-03-23 MED ORDER — INFLUENZA VAC SPLIT QUAD 0.5 ML IM SUSY
0.5000 mL | PREFILLED_SYRINGE | INTRAMUSCULAR | Status: AC
  Administered 2014-03-25: 0.5 mL via INTRAMUSCULAR
  Filled 2014-03-23: qty 0.5

## 2014-03-23 MED ORDER — TETANUS-DIPHTH-ACELL PERTUSSIS 5-2.5-18.5 LF-MCG/0.5 IM SUSP
0.5000 mL | Freq: Once | INTRAMUSCULAR | Status: AC
  Administered 2014-03-24: 0.5 mL via INTRAMUSCULAR
  Filled 2014-03-23: qty 0.5

## 2014-03-23 MED ORDER — WITCH HAZEL-GLYCERIN EX PADS: 1.0000 "application " | MEDICATED_PAD | CUTANEOUS | Status: DC | PRN

## 2014-03-23 MED ORDER — NALOXONE HCL 0.4 MG/ML IJ SOLN: 0.4000 mg | INTRAMUSCULAR | Status: DC | PRN

## 2014-03-23 MED ORDER — SODIUM BICARBONATE 8.4 % IV SOLN
INTRAVENOUS | Status: AC
Start: 2014-03-23 — End: 2014-03-23
  Filled 2014-03-23: qty 50

## 2014-03-23 MED ORDER — KETOROLAC TROMETHAMINE 30 MG/ML IJ SOLN
INTRAMUSCULAR | Status: AC
  Filled 2014-03-23: qty 1

## 2014-03-23 MED ORDER — NALBUPHINE HCL 10 MG/ML IJ SOLN
5.0000 mg | Freq: Once | INTRAMUSCULAR | Status: AC | PRN
  Administered 2014-03-23: 5 mg via INTRAVENOUS

## 2014-03-23 MED ORDER — SCOPOLAMINE 1 MG/3DAYS TD PT72
1.0000 | MEDICATED_PATCH | Freq: Once | TRANSDERMAL | Status: DC
  Filled 2014-03-23: qty 1

## 2014-03-23 MED ORDER — MEPERIDINE HCL 25 MG/ML IJ SOLN
INTRAMUSCULAR | Status: AC
  Filled 2014-03-23: qty 1

## 2014-03-23 MED ORDER — NALOXONE HCL 1 MG/ML IJ SOLN
1.0000 ug/kg/h | INTRAVENOUS | Status: DC | PRN
  Filled 2014-03-23: qty 2

## 2014-03-23 MED ORDER — LACTATED RINGERS IV SOLN
INTRAVENOUS | Status: DC
  Administered 2014-03-23 (×2): via INTRAVENOUS

## 2014-03-23 MED ORDER — OXYCODONE-ACETAMINOPHEN 5-325 MG PO TABS
1.0000 | ORAL_TABLET | ORAL | Status: DC | PRN
  Administered 2014-03-24 – 2014-03-25 (×2): 1 via ORAL
  Filled 2014-03-23 (×2): qty 1

## 2014-03-23 MED ORDER — OXYCODONE-ACETAMINOPHEN 5-325 MG PO TABS
2.0000 | ORAL_TABLET | ORAL | Status: DC | PRN
Start: 2014-03-23 — End: 2014-03-25

## 2014-03-23 MED ORDER — SENNOSIDES-DOCUSATE SODIUM 8.6-50 MG PO TABS
2.0000 | ORAL_TABLET | ORAL | Status: DC
  Administered 2014-03-23 – 2014-03-25 (×2): 2 via ORAL
  Filled 2014-03-23 (×2): qty 2

## 2014-03-23 MED ORDER — ZOLPIDEM TARTRATE 5 MG PO TABS: 5.0000 mg | ORAL_TABLET | Freq: Every evening | ORAL | Status: DC | PRN

## 2014-03-23 MED ORDER — MEPERIDINE HCL 25 MG/ML IJ SOLN
INTRAMUSCULAR | Status: DC | PRN
  Administered 2014-03-23 (×2): 12.5 mg via INTRAVENOUS

## 2014-03-23 MED ORDER — DIPHENHYDRAMINE HCL 50 MG/ML IJ SOLN: 12.5000 mg | INTRAMUSCULAR | Status: DC | PRN

## 2014-03-23 MED ORDER — LANOLIN HYDROUS EX OINT: 1.0000 "application " | TOPICAL_OINTMENT | CUTANEOUS | Status: DC | PRN

## 2014-03-23 MED ORDER — DIPHENHYDRAMINE HCL 25 MG PO CAPS: 25.0000 mg | ORAL_CAPSULE | Freq: Four times a day (QID) | ORAL | Status: DC | PRN

## 2014-03-23 MED ORDER — SIMETHICONE 80 MG PO CHEW
80.0000 mg | CHEWABLE_TABLET | ORAL | Status: DC
  Administered 2014-03-23 – 2014-03-25 (×2): 80 mg via ORAL
  Filled 2014-03-23 (×2): qty 1

## 2014-03-23 MED ORDER — KETOROLAC TROMETHAMINE 30 MG/ML IJ SOLN: 30.0000 mg | Freq: Four times a day (QID) | INTRAMUSCULAR | Status: DC | PRN

## 2014-03-23 MED ORDER — SODIUM CHLORIDE 0.9 % IJ SOLN: 3.0000 mL | INTRAMUSCULAR | Status: DC | PRN

## 2014-03-23 MED ORDER — NALBUPHINE HCL 10 MG/ML IJ SOLN: 5.0000 mg | Freq: Once | INTRAMUSCULAR | Status: AC | PRN

## 2014-03-23 NOTE — Plan of Care (Signed)
Problem: Discharge Progression Outcomes Goal: MMR given as ordered Outcome: Not Applicable Date Met:  03/23/14     

## 2014-03-23 NOTE — Anesthesia Postprocedure Evaluation (Signed)
  Anesthesia Post-op Note  Patient: Shelia Ford  Procedure(s) Performed: Procedure(s): CESAREAN SECTION (N/A)  Patient is awake, responsive, moving her legs, and has signs of resolution of her numbness. Pain and nausea are reasonably well controlled. Vital signs are stable and clinically acceptable. Oxygen saturation is clinically acceptable. There are no apparent anesthetic complications at this time. Patient is ready for discharge.

## 2014-03-23 NOTE — Op Note (Signed)
03/22/2014 - 03/23/2014  12:54 AM  PATIENT:  Shelia Ford  23 y.o. female  PRE-OPERATIVE DIAGNOSIS:  Delivery of Baby, pregnancy 39 wk prior cesarean section  X 3, repeat cesarean section  POST-OPERATIVE DIAGNOSIS:  Delivery of Baby pregnancy 39 wk prior cesarean section  X 3, repeat cesarean section, extensive pelvic adhesions    PROCEDURE:  Procedure(s): CESAREAN SECTION (N/A)  Low transverse cesarean section,with T-extension of uterine incision  SURGEON:  Surgeon(s) and Role:    * Shelia BurrowJohn Tabari Volkert V, MD - Primary  PHYSICIAN ASSISTANT:   ASSISTANTS: none   ANESTHESIA:   epidural  EBL:  Total I/O In: 3400 [[name].:3400] Out: 1300 [Urine:700; Blood:600]  BLOOD ADMINISTERED:none  DRAINS: (10mm) Jackson-Pratt drain(s) with closed bulb suction in the subfascial space and Urinary Catheter (Foley)   LOCAL MEDICATIONS USED:  NONE  SPECIMEN:  Source of Specimen:  placenta to L&D  DISPOSITION OF SPECIMEN:  L&D  COUNTS:  YES  TOURNIQUET:  * No tourniquets in log *  DICTATION: .Dragon Dictation  PLAN OF CARE: Admit to inpatient   PATIENT DISPOSITION:  PACU - hemodynamically stable.   Delay start of Pharmacological VTE agent (>24hrs) due to surgical blood loss or risk of bleeding: yes  Details of procedure. Patient was taken to the operating room and epidural inserted after spinal anesthesia had been ineffective.timeout was conducted. the patientwas initially comfortable butafter excision of cicatrix and sharp dissection down to the fascia and elevating the fascia off of the densely fibrotic rectus muscles patient became quite uncomfortable. A enter the abdominal cavity in the midline were unsuccessful the patient had the epidural topped up quite generously so the analgesia was achieved. There was no way in the uterus and the anterior abdominal wall. Dissection cephalad and caudad could not identify the peritoneal cavity at any point. The bladder dome could be identified and  retracted inferiorly. There was more identifiable tissue on the lower uterine segment and the bladder could be retracted inferiorly. Urine was clear. There is uterine incision was made in the lower uterine segment over the fetal vertex which was rotated into the incision. The restriction of the muscles despite all efforts to release the adhesions resulted in a relatively tight fit. 2 Kiwi vacuum pop OCCURRED EVEN AS WE TRIED TO EXTRACT THE VERTEX. IT WAS NECESSARY TO MAKE A T INCISION IN THE CENTER of HER PORTION OF THE UTERINE INCISION EXTENDING AT 3 CM INTO THE LOWER UTERINE SEGMENT TIME THE FETAL VERTEX COULD BE DELIVERED WITHOUTfurther DIFFICULTY.cord was clamped and the infant passed off to waiting pediatrician where Apgars 9 and 9 were assigned. There was meconium discoloration of the amniotic fluid noted. Placenta was delivered easily and membrane remnants in the lower uterine segment were carefully peeled off and significant effort made to ensure that we did not leave any membrane remnants. Ring forceps could be passed through the cervix without difficulty. Uterus was irrigated with saline solution. The T portion the incision 3 cm in length was closed with a single-layer running locking uterine incision. The lower uterine segment was closed with single-layer running locking 0 Monocryl there were 2 figure-of-eight sutures were required on the anterior portion of the uterus due to bleeding from the dissection efforts trying to identify the peritoneum. Figure-of-eight sutures result in sufficient hemostasis that we eating was controlled. This was made to place a flat 10 mm JP drain in this space at completion the procedure. The rectus muscles were pulled into the midline with 2 interrupted sutures of 2-0  Vicryl. The JP drain was pulled from a suprapubic stab incision and placed beneath the rectus muscles, and front of the uterus in a potential space was developed. It should be noted that there was absolutely  no site at which the anterior peritoneal cavity could be entered. J-P drain was stitched in place. The fascia was closed with running 0 Vicryl, subcutaneous tissues irrigated and closed with interrupted 2-0 Vicryl then subcuticular 4-0 Vicryl closure of the skin incision with a Mellody DanceKeith needle performed. Sponge and needle counts were correct throughout patient went to recovery room in stable condition EBL 600 cc It should be noted that any future,lower abdominal incisions would likely be necessary to have a lower VERTICAL INCISION., OR SIMPLY HOPE TO REPEAT HER GOOD FORTUNE TO ENTER THE uterus without ever actually entering the peritoneal cavity.

## 2014-03-23 NOTE — Lactation Note (Signed)
This note was copied from the chart of Shelia Ford. Lactation Consultation Note  Interpreter present. P3, Ex BF. Reviewed how to breastfeed with phototherapy.  Encouraged feeding often to reduce bilirubin. Mother states she has no breastmilk.  Demonstrated hand expression and good flow expressed. Mother placed baby in cradle hold.  Baby latched on breast and suckled effectively, swallows observed. Light placed on baby's back during feeding.  Mom encouraged to feed baby 8-12 times/24 hours and with feeding cues.  Reviewed cluster feeding. Mom made aware of O/P services, breastfeeding support groups, community resources, and our phone # for post-discharge questions.    Patient Name: Shelia Ford EXBMW'UToday's Date: 03/23/2014 Reason for consult: Initial assessment   Maternal Data Has patient been taught Hand Expression?: Yes Does the patient have breastfeeding experience prior to this delivery?: Yes  Feeding Feeding Type: Breast Fed  LATCH Score/Interventions Latch: Grasps breast easily, tongue down, lips flanged, rhythmical sucking.  Audible Swallowing: A few with stimulation  Type of Nipple: Everted at rest and after stimulation  Comfort (Breast/Nipple): Soft / non-tender     Hold (Positioning): No assistance needed to correctly position infant at breast.  LATCH Score: 9  Lactation Tools Discussed/Used     Consult Status Consult Status: Follow-up Date: 03/24/14 Follow-up type: In-patient    Dahlia ByesBerkelhammer, Mercedes Valeriano [facility] 03/23/2014, 3:18 PM

## 2014-03-23 NOTE — Progress Notes (Signed)
Attempted to call report no answer called charge phone Shanda BumpsJessica, RN said she would call back.  [facility] to [facility], report sheet sent with patient.  AC to transport. Patient stable with no complaints of pain.  Dr Cristela BlueKyle Jackson signed patient out after check at bedside in PACU.

## 2014-03-23 NOTE — Progress Notes (Signed)
Post Partum Day 1 Subjective:  [name] is a 23 y.o. G4P1001 2065w4d s/p rLTCS, no prenatal care.  No acute events overnight.  Has not yet ambulated, still has foley in place. She denies nausea or vomiting.  Pain is well controlled.  Plan for birth control is Depo-Provera.  Method of Feeding: breast  Objective: Blood pressure 108/85, pulse 65, temperature 98.7 F (37.1 C), temperature source Oral, resp. rate 18, height 5' 0.5" (1.537 m), weight 176 lb 4 oz (79.946 kg), last menstrual period 06/16/2013, SpO2 95 %, unknown if currently breastfeeding.  Physical Exam:  General: alert, cooperative and no distress Lochia:normal flow Chest: CTAB Heart: RRR no m/r/g Abdomen: +BS, soft, nontender,  Uterine Fundus: firm, wound dressed DVT Evaluation: No evidence of DVT seen on physical exam. Extremities: no edema   Recent Labs  03/22/14 1840 03/23/14 0627  HGB 11.9* 8.6*  HCT 35.1* 25.9*    Assessment/Plan:  ASSESSMENT: [name] is a 23 y.o. G4P1001 9365w4d s/p rLTCS, no prenatal care - SW consult  Plan for discharge tomorrow   LOS: 1 day   Matsue Strom ROCIO 03/23/2014, 11:42 AM

## 2014-03-23 NOTE — Transfer of Care (Signed)
Immediate Anesthesia Transfer of Care Note  Patient: Shelia Ford  Procedure(s) Performed: Procedure(s): CESAREAN SECTION (N/A)  Patient Location: PACU  Anesthesia Type:Epidural  Level of Consciousness: awake  Airway & Oxygen Therapy: Patient Spontanous Breathing  Post-op Assessment: Report given to PACU RN and Post -op Vital signs reviewed and stable  Post vital signs: stable  Complications: No apparent anesthesia complications

## 2014-03-23 NOTE — Addendum Note (Signed)
Addendum  created 03/23/14 1551 by Graciela HusbandsWynn O Subrina Vecchiarelli, CRNA   Modules edited: Notes Section   Notes Section:  File: [id number]

## 2014-03-23 NOTE — Plan of Care (Signed)
Problem: Consults Goal: Skin Care Protocol Initiated - if Braden Score 18 or less If consults are not indicated, leave blank or document N/A  Outcome: Not Applicable Date Met:  70/96/28 Goal: Nutrition Consult-if indicated Outcome: Not Applicable Date Met:  36/62/94 Goal: Diabetes Guidelines if Diabetic/Glucose > 140 If diabetic or lab glucose is > 140 mg/dl - Initiate Diabetes/Hyperglycemia Guidelines & Document Interventions  Outcome: Not Applicable Date Met:  76/54/65  Problem: Phase I Progression Outcomes Goal: Pain controlled with appropriate interventions Outcome: Completed/Met Date Met:  03/23/14 Goal: Foley catheter patent Outcome: Completed/Met Date Met:  03/23/14 Goal: OOB as tolerated unless otherwise ordered Outcome: Completed/Met Date Met:  03/23/14 Goal: IS, TCDB as ordered Outcome: Completed/Met Date Met:  03/23/14 Goal: VS, stable, temp < 100.4 degrees F Outcome: Completed/Met Date Met:  03/23/14 Goal: Initial discharge plan identified Outcome: Completed/Met Date Met:  03/23/14  Problem: Phase II Progression Outcomes Goal: Rh isoimmunization per orders Outcome: Completed/Met Date Met:  03/23/14 Goal: Tolerating diet Outcome: Completed/Met Date Met:  03/23/14

## 2014-03-23 NOTE — Anesthesia Postprocedure Evaluation (Signed)
Anesthesia Post Note  Patient: [name]  Procedure(s) Performed: Procedure(s) (LRB): CESAREAN SECTION (N/A)  Anesthesia type: Spinal  Patient location: Mother/Baby  Post pain: Pain level controlled  Post assessment: Post-op Vital signs reviewed  Last Vitals:  Filed Vitals:   03/23/14 1400  BP: 112/56  Pulse: 70  Temp: 36.9 C  Resp: 18    Post vital signs: Reviewed  Level of consciousness: awake  Complications: No apparent anesthesia complications

## 2014-03-23 NOTE — Brief Op Note (Signed)
03/22/2014 - 03/23/2014  12:54 AM  PATIENT:  Shelia Ford  23 y.o. female  PRE-OPERATIVE DIAGNOSIS:  Delivery of Baby, pregnancy 39 wk prior cesarean section  X 3, repeat cesarean section  POST-OPERATIVE DIAGNOSIS:  Delivery of Baby pregnancy 39 wk prior cesarean section  X 3, repeat cesarean section, extensive pelvic adhesions    PROCEDURE:  Procedure(s): CESAREAN SECTION (N/A)  Low transverse cesarean section,with T-extension of uterine incision  SURGEON:  Surgeon(s) and Role:    * Tilda BurrowJohn Mckennah Kretchmer V, MD - Primary  PHYSICIAN ASSISTANT:   ASSISTANTS: none   ANESTHESIA:   epidural  EBL:  Total I/O In: 3400 [I.V.:3400] Out: 1300 [Urine:700; Blood:600]  BLOOD ADMINISTERED:none  DRAINS: (10mm) Jackson-Pratt drain(s) with closed bulb suction in the subfascial space and Urinary Catheter (Foley)   LOCAL MEDICATIONS USED:  NONE  SPECIMEN:  Source of Specimen:  placenta to L&D  DISPOSITION OF SPECIMEN:  L&D  COUNTS:  YES  TOURNIQUET:  * No tourniquets in log *  DICTATION: .Dragon Dictation  PLAN OF CARE: Admit to inpatient   PATIENT DISPOSITION:  PACU - hemodynamically stable.   Delay start of Pharmacological VTE agent (>24hrs) due to surgical blood loss or risk of bleeding: yes

## 2014-03-23 NOTE — Progress Notes (Signed)
I assisted Musicianue RN with initial assessment, and questions, by Orlan LeavensViria Alvarez, Interpreter

## 2014-03-23 NOTE — Progress Notes (Signed)
I was present during C-Section with Dr Emelda FearFerguson and Dr Jean RosenthalJackson, by Orlan LeavensViria Alvarez , Interprter

## 2014-03-24 ENCOUNTER — Encounter (HOSPITAL_COMMUNITY): Payer: Self-pay | Admitting: Obstetrics & Gynecology

## 2014-03-24 DIAGNOSIS — N736 Female pelvic peritoneal adhesions (postinfective): Secondary | ICD-10-CM | POA: Diagnosis present

## 2014-03-24 DIAGNOSIS — Z98891 History of uterine scar from previous surgery: Secondary | ICD-10-CM

## 2014-03-24 MED ORDER — OXYCODONE-ACETAMINOPHEN 5-325 MG PO TABS: 1.0000 | ORAL_TABLET | ORAL | Status: DC | PRN

## 2014-03-24 NOTE — Progress Notes (Signed)
Clinical Social Work Department PSYCHOSOCIAL ASSESSMENT - MATERNAL/CHILD 03/24/2014  Patient:  Shelia Ford,Shelia Ford  Account Number:  [id number]  Admit Date:  03/22/2014  Childs Name:   Shelia Ford    Clinical Social Worker:  Okley Magnussen, LCSW   Date/Time:  03/23/2014 03:30 PM  Date Referred:  03/23/2014   Referral source  Central [facility]     Other referral source:    I:  FAMILY / HOME ENVIRONMENT Child's legal guardian:    Guardian - Name Guardian - Age Guardian - Address  Shelia Ford,Shelia Ford 23 [address]  [place], [place] [address]  Shelia Ford, Shelia Ford     Other household support members/support persons Other support:   Mother's brother is reportedly supportive    II  PSYCHOSOCIAL DATA Information Source:    Financial and Community Resources Employment:   FOB is employed   Financial resources:  Self Pay If Medicaid - County:    School / Grade:   Maternity Care Coordinator / Child Services Coordination / Early Interventions:  Cultural issues impacting care:    III  STRENGTHS Strengths  Supportive family/friends  Home prepared for Child (including basic supplies)  Adequate Resources   Strength comment:    IV  RISK FACTORS AND CURRENT PROBLEMS Current Problem:       V  SOCIAL WORK ASSESSMENT Met with mother who was pleasant and receptive to social work intervention.   Spanish translator Shelia Ford facilitated the interview.  She is a single parent with two other dependents ages 9 and 6 living in [place] with relatives.  Informed that she has been living in the [place] for the past 2 years.   FOB is employed.  Mother states that she is currently residing with her brother. She reports adequate support from family and friends. Mother states that she did not seek prenatal care because she was so far along in the pregnancy.   She denies any hx of substance abuse or mental illness.   She reports adequate support from family and friends, and  they will help her with connecting with WIC and food stamps program. No acute social concerns related at this time.   Mother informed of social work availability.      VI SOCIAL WORK PLAN Social Work Plan   No Barriers to Discharge   Type of pt/family education:   If child protective services report - county:   If child protective services report - date:   Information/referral to community resources comment:   Provided information to apply for WIC and medicaid    Mother informed of the hospital drug screen policy  Other social work plan:   Will continue to monitor drug screen      

## 2014-03-24 NOTE — Progress Notes (Signed)
Post Partum Day 2 Subjective:  Shelia Ford is a 23 y.o. G4P1001 862w4d s/p rLTCS, no prenatal care.  Has ambulated without difficulty, tolerating PO, has urinated, passing gas but has not had bowel movement yet She denies nausea or vomiting.  Pain is well controlled.  Plan for birth control is Depo-Provera.  Method of Feeding: breast  Objective: Blood pressure 109/60, pulse 70, temperature 98.5 F (36.9 C), temperature source Oral, resp. rate 18, height 5' 0.5" (1.537 m), weight 176 lb 4 oz (79.946 kg), last menstrual period 06/16/2013, SpO2 95 %, unknown if currently breastfeeding.  Physical Exam:  General: alert, cooperative and no distress Lochia:normal flow Chest: CTAB Heart: RRR no m/r/g Abdomen: +BS, soft, nontender,  Uterine Fundus: firm, wound dressed with honeycomb and appears clean/without new drainage, JP drain in place DVT Evaluation: No evidence of DVT seen on physical exam. Extremities: no edema   Recent Labs  03/22/14 1840 03/23/14 0627  HGB 11.9* 8.6*  HCT 35.1* 25.9*   JP output past 24h: 120  Assessment/Plan:  ASSESSMENT: Shelia Ford is a 23 y.o. G4P1001 1762w4d s/p rLTCS, no prenatal care - discussed JP drain care and need for removal in about 6days  Plan for discharge tomorrow   LOS: 2 days   Shelia Ford 03/24/2014, 1:53 PM

## 2014-03-24 NOTE — Plan of Care (Signed)
Problem: Phase I Progression Outcomes Goal: Voiding adequately Outcome: Completed/Met Date Met:  03/24/14

## 2014-03-24 NOTE — Plan of Care (Signed)
Problem: Phase I Progression Outcomes Goal: Other Phase I Outcomes/Goals Outcome: Completed/Met Date Met:  03/24/14  Problem: Phase II Progression Outcomes Goal: Pain controlled on oral analgesia Outcome: Completed/Met Date Met:  03/24/14 Goal: Progress activity as tolerated unless otherwise ordered Outcome: Completed/Met Date Met:  03/24/14 Goal: Afebrile, VS remain stable Outcome: Completed/Met Date Met:  03/24/14 Goal: Incision intact & without signs/symptoms of infection Outcome: Completed/Met Date Met:  03/24/14 Goal: Other Phase II Outcomes/Goals Outcome: Completed/Met Date Met:  03/24/14

## 2014-03-24 NOTE — Progress Notes (Signed)
Checked on patient.  Assisted RN with interpretation of baby's treatment and update on results of blood work.  Also, assisted RN with interpretations of teachings concerning baby and mother's home care. Spanish Interpreter - Joselyn GlassmanBenita Sanchez

## 2014-03-24 NOTE — Progress Notes (Signed)
Assisted RN with interpretation of assessment. °Spanish Interpreter - Benita Sanchez °

## 2014-03-24 NOTE — Progress Notes (Signed)
Assisted RN with Patient Assessment.  Spanish Interpreter - Shelia GlassmanBenita Ford

## 2014-03-24 NOTE — Progress Notes (Signed)
Assisted Social Worker Cumi with interpretation of information concerning prenatal care.  Spanish interpreter - Joselyn GlassmanBenita Sanchez

## 2014-03-24 NOTE — Progress Notes (Signed)
Assisted PT with ordering dinner and breakfast.  Spanish Interpreter - Joselyn GlassmanBenita Ford

## 2014-03-24 NOTE — Progress Notes (Addendum)
Checked on patient.  Assisted RN with interpretation of baby's treatment. Also, assisted RN with interpretations of teachings concerning baby and mother's home care. Spanish Interpreter - Shelia Ford

## 2014-03-24 NOTE — Progress Notes (Signed)
Assisted Windell MouldingRuth with Lactation on interpretation of instructions.  Spanish Interpreter - Joselyn GlassmanBenita Sanchez

## 2014-03-24 NOTE — Progress Notes (Signed)
Assisted with Patient in ordering dinner and breakfast.  Spanish Interpreter - Joselyn GlassmanBenita Sanchez

## 2014-03-25 ENCOUNTER — Encounter (HOSPITAL_COMMUNITY): Payer: Self-pay | Admitting: Advanced Practice Midwife

## 2014-03-25 MED ORDER — INFLUENZA VAC SPLIT QUAD 0.5 ML IM SUSY
0.5000 mL | PREFILLED_SYRINGE | INTRAMUSCULAR | Status: DC
  Filled 2014-03-25: qty 0.5

## 2014-03-25 NOTE — Progress Notes (Signed)
Ur chart review completed.  

## 2014-03-25 NOTE — Progress Notes (Signed)
Subjective: Postpartum Day 3: Cesarean Delivery Patient reports incisional pain, tolerating PO, + flatus and no problems voiding.  The baby is on the bililight for Jaundice. [employer] Interpreter is used for interpretation. The patient has a JP drain in the anterior abdominal wall exiting thru a suprapubic site. This will need removal this Thursday or Friday.  Objective: Vital signs in last 24 hours: Temp:  [98 F (36.7 C)-99 F (37.2 C)] 99 F (37.2 C) (11/16 0643) Pulse Rate:  [85-87] 87 (11/16 0643) Resp:  [18-20] 20 (11/16 0643) BP: (122-131)/(75-87) 122/87 mmHg (11/16 [id number])  Physical Exam:  General: alert, cooperative and no distress Lochia: appropriate Uterine Fundus: firm Incision: healing well, no significant drainage, no dehiscence, no significant erythema, Jp drain 50 cc yesterday DVT Evaluation: No evidence of DVT seen on physical exam.   Recent Labs  03/22/14 1840 03/23/14 0627  HGB 11.9* 8.6*  HCT 35.1* 25.9*    Assessment/Plan: Status post Cesarean section. Doing well postoperatively.  Clinic appt n 4 days to remove JP drain. Discharge home.  Belinda Schlichting V 03/25/2014, 7:53 AM

## 2014-03-25 NOTE — Discharge Summary (Signed)
Obstetric Discharge Summary Reason for Admission: onset of labor and cesarean section Prenatal Procedures: none Intrapartum Procedures: cesarean: low cervical, transverse Postpartum Procedures: none Complications-Operative and Postpartum: none HEMOGLOBIN  Date Value Ref Range Status  03/23/2014 8.6* 12.0 - 15.0 g/dL Final    Comment:    REPEATED TO VERIFY DELTA CHECK NOTED    HCT  Date Value Ref Range Status  03/23/2014 25.9* 36.0 - 46.0 % Final    Hospital Course:  Shelia Ford was admitted for onset of labor and repeat LTCS.  JP drain placed during surgery.  By POD#3, patient is doing well, JP drain with minimal drainage.  Breastfeeding, plan for Depo=provera for contraception.   Physical Exam:  General: alert and cooperative Lochia: appropriate Uterine Fundus: firm Incision: healing well, no significant drainage, no dehiscence, JP drain output 40 yesterday DVT Evaluation: No evidence of DVT seen on physical exam.  Discharge Diagnoses: Term Pregnancy-delivered  Discharge Information: Date: 03/25/2014 Activity: pelvic rest Diet: routine Medications: Percocet Condition: stable Instructions: refer to practice specific booklet Discharge to: home Follow-up Information    [facility]   Why:  For wound re-check an JP drain removal   Contact information:   [address] [place] [id number] [phone]      [facility].   Why:  As needed for emergencies   Contact information:   [address] [id number] [place] [id number] [phone]      Newborn Data: Live born female  Birth Weight: 6 lb 10.7 oz (3025 g) APGAR: 9, 9  Home with mother.  Shirlee LatchBacigalupo, Zohar Laing 03/25/2014, 9:56 AM

## 2014-03-25 NOTE — Discharge Instructions (Signed)
Cuidados en el postparto luego de un parto por cesárea  °(Postpartum Care After Cesarean Delivery) °Después del parto (período de postparto), la estadía normal en el hospital es de 24-72 horas. Si hubo problemas con el trabajo de parto o el parto, o si tiene otros problemas médicos, es posible que deba permanecer en el hospital por más tiempo.  °Mientras esté en el hospital, recibirá ayuda e instrucciones sobre cómo cuidar de usted misma y de su bebé recién nacido durante el postparto.  °Mientras esté en el hospital:  °· Es normal que sienta dolor o molestias en la incisión en el abdomen. Asegúrese de decirle a las enfermeras si siente dolor, así como donde siente el dolor y qué empeora el dolor. °· Si está amamantando, puede sentir contracciones dolorosas en el útero durante algunas semanas. Esto es normal. Las contracciones ayudan a que el útero vuelva a su tamaño normal. °· Es normal tener algo de sangrado después del parto. °· Durante los primeros 1-3 días después del parto, el flujo es de color rojo y la cantidad puede ser similar a un período. °· Es frecuente que el flujo se inicie y se detenga. °· En los primeros días, puede eliminar algunos coágulos pequeños. Informe a las enfermeras si comienza a eliminar coágulos grandes o aumenta el flujo. °· No  elimine los coágulos de sangre por el inodoro antes de que la enfermera los vea. °· Durante los próximos 3 a 10 días después del parto, el flujo debe ser más acuoso y rosado o marrón. °· De diez a catorce días después del parto, el flujo debe ser una pequeña cantidad de secreción de color blanco amarillento. °· La cantidad de flujo disminuirá en las primeras semanas después del parto. El flujo puede detenerse en 6-8 semanas. La mayoría de las mujeres no tienen más flujo a las 12 semanas después del parto. °· Usted debe cambiar sus apósitos con frecuencia. °· Lávese bien las manos con agua y jabón durante al menos 20 segundos después de cambiar el apósito, usar el  baño o antes de sostener o alimentar a su recién nacido. °· Se le quitará la vía intravenosa (IV) cuando ya esté bebiendo suficientes líquidos. °· El tubo de drenaje para la orina (catéter urinario) que se inserta antes del parto puede ser retirado luego de 6-8 horas después del parto o cuando las piernas vuelvan a tener sensibilidad. Usted puede sentir que tiene que vaciar la vejiga durante las primeras 6-8 horas después de que le quiten el catéter. °· Si se siente débil, mareada o se desmaya, llame a su enfermera antes de levantarse de la cama por primera vez y antes de tomar una ducha por primera vez. °· En los primeros días después del parto, podrá sentir las mamas sensibles y llenas. Esto se llama congestión. La sensibilidad en los senos por lo general desaparece dentro de las 48-72 horas después de que ocurre la congestión. También puede notar que la leche se escapa de sus senos. Si no está amamantando no estimule sus pechos. La estimulación de las mamas hace que sus senos produzcan más leche. °· Pasar tanto tiempo como sea posible con el bebé recién nacido es muy importante. Durante este tiempo, usted y su bebé deben sentirse cerca y conocerse uno al otro. Tener al bebé en su habitación (alojamiento conjunto) ayudará a fortalecer el vínculo con el bebé recién nacido. Esto le dará tiempo para conocerlo y atenderlo de manera cómoda. °· Las hormonas se modifican después del parto. A   veces, los cambios hormonales pueden causar tristeza o ganas de llorar por un tiempo. Estos sentimientos no deben durar ms de [employer]. Si duran ms que eso, debe hablar con su mdico.  Si lo desea, hable con su mdico acerca de los mtodos de planificacin familiar o mtodos anticonceptivos.  Hable con su mdico acerca de las vacunas. El mdico puede indicarle que se aplique las siguientes vacunas antes de salir del hospital:  [place] contra el ttanos, la difteria y la tos ferina (Tdap) o el ttanos y la difteria (Td).  Es muy importante que usted y su familia (incluyendo a los abuelos) u otras personas que cuidan al recin nacido estn al da con las vacunas Tdap o Td. Las vacunas Tdap o Td pueden ayudar a proteger al recin nacido de enfermedades.  Inmunizacin contra la rubola.  Inmunizacin contra la varicela.  Inmunizacin contra la gripe. Usted debe recibir esta vacunacin anual si no la ha recibido Academic librarian. Document Released: 04/12/2012 [facility] Patient Information 2015 [place], [place]. This information is not intended to replace advice given to you by your health care provider. Make sure you discuss any questions you have with your health care provider.   Lactancia materna (Breastfeeding) Decidir Museum/gallery exhibitions officer es una de las mejores elecciones que puede hacer por usted y su beb. El cambio hormonal durante el Psychiatrist produce el desarrollo del tejido mamario y [place] la cantidad y el tamao de los conductos galactforos. Estas hormonas tambin permiten que las protenas, los azcares y las grasas de la sangre produzcan la [employer] Resources materna en las glndulas productoras de [place]. Las hormonas impiden que la leche materna sea liberada antes del nacimiento del beb, adems de impulsar el flujo de leche luego del nacimiento. Una vez que ha comenzado a Museum/gallery exhibitions officer, Conservation officer, nature beb, as Immunologist succin o Theatre manager, pueden estimular la liberacin de [place] de las glndulas productoras de [place].  LOS BENEFICIOS DE AMAMANTAR Para el beb  La primera leche (calostro) ayuda a Careers information officer funcionamiento del sistema digestivo del beb.  La leche tiene anticuerpos que ayudan a Radio producer las infecciones en el beb.  El beb tiene una menor incidencia de asma, alergias y del sndrome de muerte sbita del lactante.  Los nutrientes en la [place] materna son mejores para el beb que la [address] maternizada y estn preparados exclusivamente para cubrir las necesidades del beb.  La leche materna mejora el desarrollo  cerebral del beb.  Es menos probable que el beb desarrolle otras enfermedades, como obesidad infantil, asma o diabetes mellitus de tipo 2. Para usted   La lactancia materna favorece el desarrollo de un vnculo muy especial entre la madre y el beb.  Es conveniente. La leche materna siempre est disponible a la Human resources officer y es [place].  La lactancia materna ayuda a quemar caloras y a perder el peso ganado durante el [place].  Favorece la contraccin del tero al tamao que tena antes del embarazo de manera ms rpida y disminuye el sangrado (loquios) despus del parto.  La lactancia materna contribuye a reducir Nurse, adult de desarrollar diabetes mellitus de tipo 2, osteoporosis o cncer de mama o de ovario en el futuro. SIGNOS DE QUE EL BEB EST HAMBRIENTO Primeros signos de [address] de [place].  Se estira.  Mueve la cabeza de un lado a otro.  Mueve la cabeza y abre la boca cuando se le toca la mejilla o la comisura de la boca (reflejo de  bsqueda).  Aumenta las vocalizaciones, tales como sonidos de succin, se relame los labios, emite arrullos, suspiros, o chirridos.  Mueve la [employer] boca.  Se chupa con ganas los dedos o las manos. Signos tardos de [employer].  Llora de manera intermitente. Signos de [employer] Corporation signos de hambre extrema requerirn que lo calme y lo consuele antes de que el beb pueda alimentarse adecuadamente. No espere a que se manifiesten los siguientes signos de hambre extrema para comenzar a Museum/gallery exhibitions officer:   Designer, jewellery.  Llanto intenso y fuerte.   Gritos. INFORMACIN BSICA SOBRE LA LACTANCIA MATERNA Iniciacin de la lactancia materna  Encuentre un lugar cmodo para sentarse o acostarse, con un buen respaldo para el cuello y la espalda.  Coloque una almohada o una manta enrollada debajo del beb para acomodarlo a la altura de la mama (si est sentada). Las almohadas para Museum/gallery exhibitions officer se  han diseado especialmente a fin de servir de apoyo para los brazos y el beb [employer].  Asegrese de que el abdomen del beb est frente al suyo.  Masajee suavemente la mama. Con las yemas de los dedos, masajee la pared del pecho hacia el pezn en un movimiento circular. Esto estimula el flujo de [place]. Es posible que Engineer, manufacturing systems este movimiento mientras amamanta si la leche fluye lentamente.  Sostenga la mama con el pulgar por arriba del pezn y los otros 4 dedos por debajo de la mama. Asegrese de que los dedos se encuentren lejos del pezn y de la boca del beb.  Empuje suavemente los labios del beb con el pezn o con el dedo.  Cuando la boca del beb se abra lo suficiente, acrquelo rpidamente a la mama e introduzca todo el pezn y la zona oscura que lo rodea (areola), tanto como sea posible, dentro de la boca del beb.  Debe haber ms areola visible por arriba del labio superior del beb que por debajo del labio inferior.  La lengua del beb debe estar entre la enca inferior y la [place].  Asegrese de que la boca del beb est en la posicin correcta alrededor del pezn (prendida). Los labios del beb deben crear un sello sobre la mama y estar doblados hacia afuera (invertidos).  Es comn que el beb succione durante 2 a 3 minutos para que comience el flujo de [place]. Cmo debe prenderse Es muy importante que le ensee al beb cmo prenderse adecuadamente a la mama. Si el beb no se prende adecuadamente, puede causarle dolor en el pezn y reducir la produccin de [place], y hacer que el beb tenga un escaso aumento de [place]. Adems, si el beb no se prende adecuadamente al pezn, puede tragar aire durante la alimentacin. Esto puede causarle molestias al beb. Hacer eructar al beb al [place] de mama puede ayudarlo a liberar el aire. Sin embargo, ensearle al beb cmo prenderse a la mama adecuadamente es la mejor manera de evitar que se sienta molesto por tragar  Oceanographer se alimenta. Signos de que el beb se ha prendido adecuadamente al pezn:   Payton Doughty o succiona de modo silencioso, sin causarle dolor.  Se escucha que traga cada 3 o 4 succiones.   Hay movimientos musculares por arriba y por delante de sus odos al Printmaker. Signos de que el beb no se ha prendido Audiological scientist al pezn:   Hace ruidos de succin o de chasquido mientras se alimenta.  Siente dolor en el pezn. Si cree que el beb no  se prendi correctamente, deslice el dedo en la comisura de la boca y Ameren Corporationcolquelo entre las encas del beb para interrumpir la succin. Intente comenzar a amamantar nuevamente. Signos de Fish farm managerlactancia materna exitosa Signos del beb:   Disminuye gradualmente el nmero de succiones o cesa la succin por completo.  Se duerme.  Relaja el cuerpo.  Retiene una pequea cantidad de Kindred Healthcareleche en la boca.  Se desprende solo del pecho. Signos que presenta usted:  Las mamas han aumentado la firmeza, el peso y el tamao 1 a 3 horas despus de Museum/gallery exhibitions officeramamantar.  Estn ms blandas inmediatamente despus de amamantar.  Un aumento del volumen de Stanleyleche, y tambin un cambio en su consistencia y color se producen hacia el quinto da de Tour managerlactancia materna.  Los pezones no duelen, ni estn agrietados ni sangran. Signos de que su beb recibe la cantidad de leche suficiente  Moja al menos 3 paales en 24 horas. La orina debe ser clara y de color amarillo plido a los [address]  [address] de [place].  Defeca al menos 3 veces en 24 horas a los [address]  [address] de [address]. La materia fecal debe ser blanda y [place] Colonyamarillenta.  Defeca al menos 3 veces en 24 horas a los [address] das de [address]. La materia fecal debe ser grumosa y Pendletonamarillenta.  No registra una prdida de peso mayor del 10% del peso al nacer durante los primeros 3 [address]  [address] de [place]ida.  Aumenta de peso un promedio de 4 a 7onzas (113 a 198g) por semana despus de los [address]  [address] de vida.  Aumenta de [place] Brookpeso, Lexingtondiariamente, de Estellemanera uniforme a Glass blower/designerpartir de los [address]  [address] de  vida, sin Passenger transport managerregistrar prdida de peso despus de las 2semanas de vida. Despus de alimentarse, es posible que el beb regurgite una pequea cantidad. Esto es frecuente. FRECUENCIA Y DURACIN DE LA LACTANCIA MATERNA El amamantamiento frecuente la ayudar a producir ms Azerbaijanleche y a Education officer, communityprevenir problemas de Engineer, miningdolor en los pezones e hinchazn en las [place]amas. Alimente al beb cuando muestre signos de hambre o si siente la necesidad de reducir la congestin de las Aileymamas. Esto se denomina "lactancia a demanda". Evite el uso del chupete mientras trabaja para establecer la lactancia (las primeras 4 a 6 semanas despus del nacimiento del beb). Despus de este perodo, podr ofrecerle un chupete. Las investigaciones demostraron que el uso del chupete durante el primer ao de vida del beb disminuye el riesgo de desarrollar el sndrome de muerte sbita del lactante (SMSL). Permita que el nio se alimente en cada mama todo lo que desee. Contine amamantando al beb hasta que haya terminado de alimentarse. Cuando el beb se desprende o se queda dormido mientras se est alimentando de la primera mama, ofrzcale la segunda. Debido a que, con frecuencia, los recin Sunoconacidos permanecen somnolientos las primeras semanas de vida, es posible que deba despertar al beb para alimentarlo. Los horarios de Acupuncturistlactancia varan de un beb a otro. Sin embargo, las siguientes reglas pueden servir como gua para ayudarla a Lawyergarantizar que el beb se alimenta adecuadamente:  Se puede amamantar a los recin nacidos (bebs de 4 semanas o menos de vida) cada 1 a 3 horas.  No deben transcurrir ms de 3 horas durante el da o 5 horas durante la noche sin que se amamante a los recin nacidos.  Debe amamantar al beb 8 veces como mnimo en un perodo de 24 horas, hasta que comience a introducir slidos en su dieta, a los 6 meses de vida aproximadamente. EXTRACCIN DE Anheuser-BuschLECHE MATERNA La  extraccin y Contractorel almacenamiento de la leche materna le permiten  asegurarse de que el beb se alimente exclusivamente de Nelsonleche materna, aun en momentos en los que no puede amamantar. Esto tiene especial importancia si debe regresar al Aleen Campitrabajo en el perodo en que an est amamantando o si no puede estar presente en los momentos en que el beb debe alimentarse. Su asesor en lactancia puede orientarla sobre cunto tiempo es seguro almacenar Ashippunleche materna.  El sacaleche es un aparato que le permite extraer leche de la mama a un recipiente estril. Luego, la leche materna extrada puede almacenarse en un refrigerador o Electrical engineercongelador. Algunos sacaleches son Birdie Riddlemanuales, Delaney Meigsmientras que otros son elctricos. Consulte a su asesor en lactancia qu tipo ser ms conveniente para usted. Los sacaleches se pueden comprar; sin embargo, algunos hospitales y grupos de apoyo a la lactancia materna alquilan Sports coachsacaleches mensualmente. Un asesor en lactancia puede ensearle cmo extraer W. R. Berkleyleche materna manualmente, en caso de que prefiera no usar un sacaleche.  CMO CUIDAR LAS MAMAS DURANTE LA LACTANCIA MATERNA Los pezones se secan, agrietan y duelen durante la Tour managerlactancia materna. Las siguientes recomendaciones pueden ayudarla a Pharmacologistmantener las TEPPCO Partnersmamas humectadas y sanas:  Careers information officervite usar jabn en los pezones.  Use un sostn de soporte. Aunque no son esenciales, las camisetas sin mangas o los sostenes especiales para Museum/gallery exhibitions officeramamantar estn diseados para acceder fcilmente a las mamas, para Museum/gallery exhibitions officeramamantar sin tener que quitarse todo el sostn o la camiseta. Evite usar sostenes con aro o sostenes muy ajustados.  Seque al aire sus pezones durante 3 a 4minutos despus de amamantar al beb.  Utilice solo apsitos de Haematologistalgodn en el sostn para Environmental health practitionerabsorber las prdidas de Prosperleche. La prdida de un poco de Public Service Enterprise Groupleche materna entre las tomas es normal.  Utilice lanolina sobre los pezones luego de Museum/gallery exhibitions officeramamantar. La lanolina ayuda a mantener la humedad normal de la piel. Si Botswanausa lanolina pura, no tiene que lavarse los pezones antes de volver a  Corporate treasureralimentar al beb. La lanolina pura no es txica para el beb. Adems, puede extraer Beazer Homesmanualmente algunas gotas de [place] Springsleche materna y Engineer, maintenance (IT)masajear suavemente esa Winn-Dixieleche sobre los pezones, para que la Royal Palm Estatesleche se seque al aire. Durante las primeras semanas despus de dar a luz, algunas mujeres pueden experimentar hinchazn en las mamas (congestin Coltonmamaria). La congestin puede hacer que sienta las mamas pesadas, calientes y sensibles al tacto. El pico de la congestin ocurre dentro de los 3 a 5 das despus del Sheatownparto. Las siguientes recomendaciones pueden ayudarla a Paramedicaliviar la congestin:  Vace por completo las mamas al QUALCOMMamamantar o Environmental health practitionerextraer leche. Puede aplicar calor hmedo en las mamas (en la ducha o con toallas hmedas para manos) antes de Museum/gallery exhibitions officeramamantar o extraer WPS Resourcesleche. Esto aumenta la circulacin y Saint Vincent and the Grenadinesayuda a que la Jeromeleche fluya. Si el beb no vaca por completo las [address] D[address]amas cuando lo [address] Aveamamanta, extraiga la Plumas Eurekaleche restante despus de que haya finalizado.  Use un sostn ajustado (para amamantar o comn) o una camiseta sin mangas durante 1 o 2 das para indicar al cuerpo que disminuya ligeramente la produccin de Jeaneretteleche.  Aplique compresas de hielo Yahoo! Incsobre las mamas, a menos que le resulte demasiado incmodo.  Asegrese de que el beb est prendido y se encuentre en la posicin correcta mientras lo alimenta. Si la congestin persiste luego de 48 horas o despus de seguir estas recomendaciones, comunquese con su mdico o un Holiday representativeasesor en lactancia. RECOMENDACIONES GENERALES PARA EL CUIDADO DE LA SALUD DURANTE LA LACTANCIA MATERNA  Consuma alimentos saludables. Alterne comidas y  colaciones, y coma 3 de cada Agricultural engineer. Dado que lo que come Danaher Corporation, es posible que algunas comidas hagan que su beb se vuelva ms irritable de lo habitual. Evite comer este tipo de alimentos si percibe que afectan de manera negativa al beb.  Beba leche, jugos de fruta y agua para Patent examiner su sed (aproximadamente 10 vasos al  Futures trader).  Descanse con frecuencia, reljese y tome sus vitaminas prenatales para evitar la fatiga, el estrs y la anemia.  Contine con los autocontroles de la mama.  Evite masticar y fumar tabaco.  Evite el consumo de alcohol y drogas. Algunos medicamentos, que pueden ser perjudiciales para el beb, pueden pasar a travs de la Colgate Palmolive. Es importante que consulte a su mdico antes de Medical sales representative, incluidos todos los medicamentos recetados y de [place], as como los suplementos vitamnicos y herbales. Puede quedar embarazada durante la lactancia. Si desea controlar la natalidad, consulte a su mdico cules son las opciones ms seguras para el beb. SOLICITE ATENCIN MDICA SI:   Usted siente que quiere dejar de Museum/gallery exhibitions officer o se siente frustrada con la lactancia.  Siente dolor en las mamas o en los pezones.  Sus pezones estn agrietados o Water quality scientist.  Sus pechos estn irritados, sensibles o calientes.  Tiene un rea hinchada en cualquiera de las mamas.  Siente escalofros o fiebre.  Tiene nuseas o vmitos.  Presenta una secrecin de otro lquido distinto de la leche materna de los pezones.  Sus mamas no se llenan antes de Museum/gallery exhibitions officer al beb para el quinto da despus del [place].  Se siente triste y deprimida.  El beb est demasiado somnoliento como para comer bien.  El beb tiene problemas para dormir.  Moja menos de 3 paales en 24 horas.  Defeca menos de 3 veces en 24 horas.  La piel del beb o la parte blanca de los ojos se vuelven amarillentas.  El beb no ha aumentado de [place] a los [address] de [place]. SOLICITE ATENCIN MDICA DE INMEDIATO SI:   El beb est muy cansado Retail buyer) y no se quiere despertar para comer.  Le sube la fiebre sin causa. Document Released: 04/26/2005 Document Revised: 05/01/2013 [facility] Patient Information 2015 [place], [place]. This information is not intended to replace advice given to you by your health care provider. Make  sure you discuss any questions you have with your health care provider.

## 2014-03-25 NOTE — Lactation Note (Signed)
This note was copied from the chart of Shelia Ford. Lactation Consultation Note Called by RN d/t engorgement, DEBP encouraged and w/interpreter instructed on engorgement care. 2 bags of ICE applied to breast, and encouraged and demonstrated breast massage and expression. Hand pumped 20 ml and given to baby in bottle. DEBP demonstrated and pumped 35ml. Dad stated BF for 12 min. And baby falls a sleep at breast. On photo therapy, baby sleepy, encouraged to stimulate baby to attempt to empty breast, if breast isn't softer after BF then pump. Don't apply ice more than 20 min.  Patient Name: Shelia Ford ZOXWR'UToday's Date: 03/25/2014 Reason for consult: Follow-up assessment;Difficult latch   Maternal Data    Feeding Feeding Type: Breast Milk Length of feed: 12 min  LATCH Score/Interventions Latch: Grasps breast easily, tongue down, lips flanged, rhythmical sucking. Intervention(s): Adjust position;Assist with latch;Breast massage;Breast compression  Audible Swallowing: Spontaneous and intermittent Intervention(s): Alternate breast massage  Type of Nipple: Everted at rest and after stimulation  Comfort (Breast/Nipple): Engorged, cracked, bleeding, large blisters, severe discomfort Problem noted: Engorgment Intervention(s): Ice  Problem noted: Severe discomfort Interventions (Mild/moderate discomfort): Hand massage;Post-pump Interventions (Severe discomfort): Double electric pum  Hold (Positioning): Assistance needed to correctly position infant at breast and maintain latch. Intervention(s): Breastfeeding basics reviewed;Support Pillows;Position options;Skin to skin  LATCH Score: 7  Lactation Tools Discussed/Used Tools: Pump;Bottle Breast pump type: Double-Electric Breast Pump Pump Review: Setup, frequency, and cleaning;Milk Storage   Consult Status Consult Status: Follow-up Date: 03/26/14 Follow-up type: In-patient    Charyl DancerCARVER, Khylie Larmore G 03/25/2014, 7:43  AM

## 2014-03-26 ENCOUNTER — Ambulatory Visit: Payer: Self-pay

## 2014-03-26 NOTE — Lactation Note (Signed)
This note was copied from the chart of Shelia Ford. Lactation Consultation Note  Patient Name: Shelia Ford UJWJX'BToday's Date: 03/26/2014 Reason for consult: Follow-up assessment  Baby 81 hours of life. [facility] nursing [facility]. Using international Spanish interpretive line [phone]), mom states that her breasts are no longer engorged and she is no longer experiencing any discomfort or pain. Enc mom to feed baby often and hand express if needed for comfort. Asked mom if she needs DEBP rental, mom declines but asks for a hand pump and one was given. Mom is an experienced BF with three older children, for 1 year each child. Referred mom to Baby and Me booklet for number of diapers to expect by day of life and EBM storage guidelines. Mom aware of OP/BFSG and LC phone line services and that the [facility] number is in her booklet. Mom asked about her drain being removed and whether she could be discharged soon. Assured parents that I would discuss with her MBU RN/[name], and then [facility] up with [name]/patient's RN regarding patient's questions as well as LC's assessment of breastfeeding dyad. Maternal Data    Feeding Feeding Type: Breast Fed Length of feed: 15 min  LATCH Score/Interventions Latch: Grasps breast easily, tongue down, lips flanged, rhythmical sucking.  Audible Swallowing: Spontaneous and intermittent  Type of Nipple: Everted at rest and after stimulation  Comfort (Breast/Nipple): Soft / non-tender Problem noted: Engorgment  Problem noted: Filling Interventions (Filling): Hand pump Interventions (Mild/moderate discomfort): Hand massage Interventions (Severe discomfort): Double electric pum  Hold (Positioning): No assistance needed to correctly position infant at breast.  LATCH Score: 10  Lactation Tools Discussed/Used     Consult Status Consult Status: Complete    Nancy NordmannWILLIARD, Lakera Viall 03/26/2014, 9:01 AM

## 2014-03-28 ENCOUNTER — Telehealth: Payer: Self-pay

## 2014-03-28 ENCOUNTER — Encounter: Payer: Self-pay | Admitting: Family Medicine

## 2014-03-28 NOTE — Telephone Encounter (Signed)
Patient missed appointment for JP drain removal. Called patient with interpreter Albertina SenegalMarly Adams. Number listed is disconnected. Attempted EC number. Continued to hear "hola, hola" no answer. Will send letter.

## 2016-03-24 IMAGING — US US OB COMP +14 WK
2 series · 12 of 28 positions shown · non-contrast
Comparison: none

[Series 1: us ob comp +14 wk mfm · 6 acquisitions, 1 frame shown (1 of 2)]
[im 6/6]
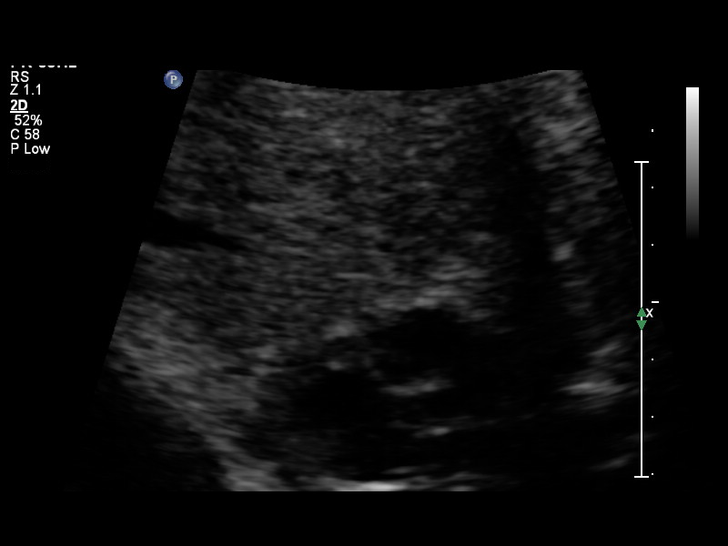

[Series 1: us ob comp +14 wk mfm · 74 acquisitions, 11 frames shown (2 of 2)]
[im 3/74]
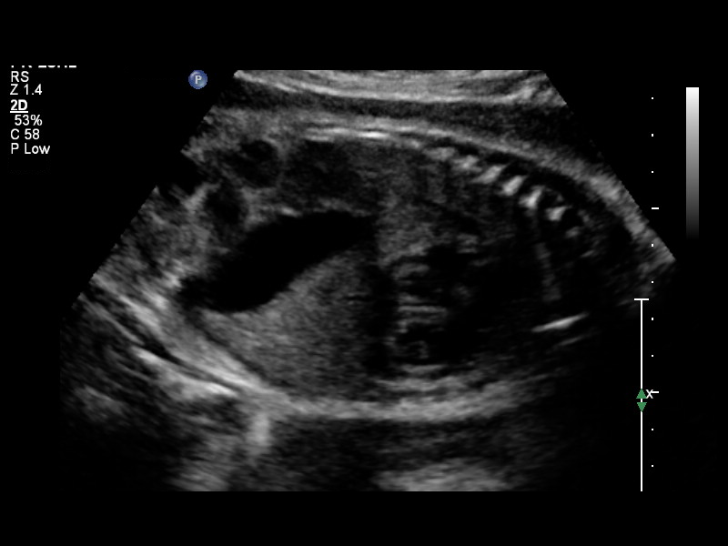
[im 9/74]
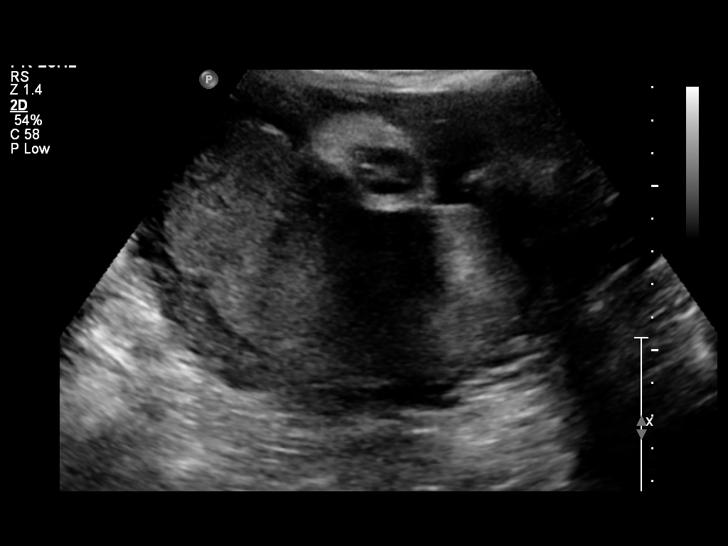
[im 18/74]
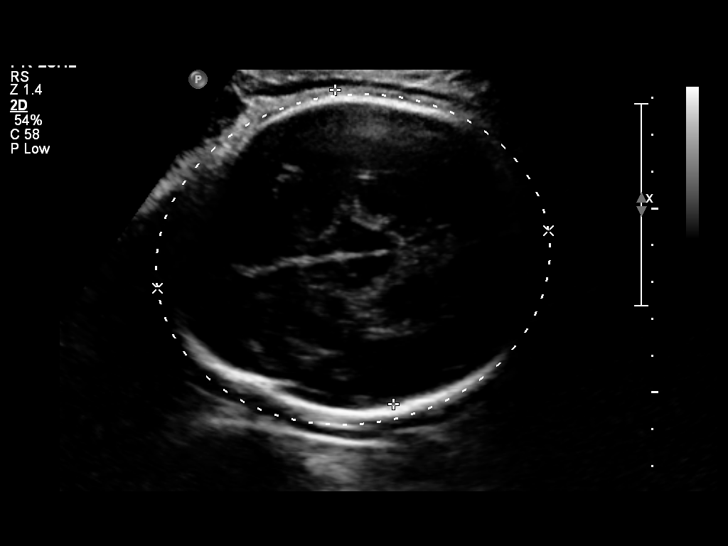
[im 24/74]
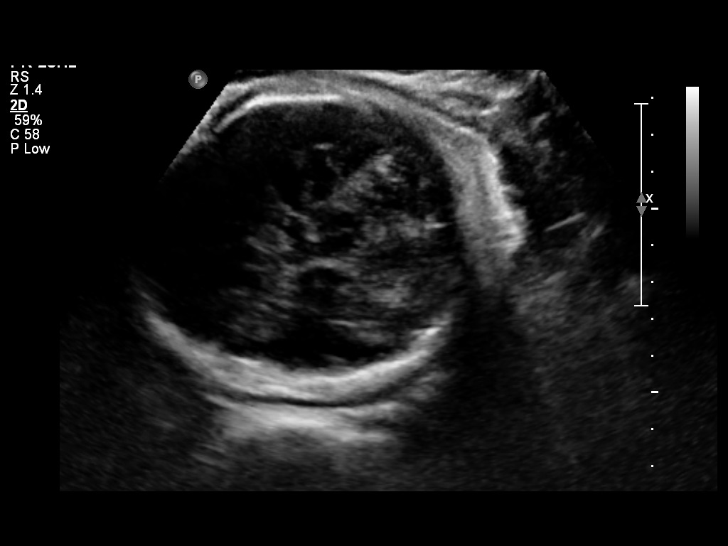
[im 30/74]
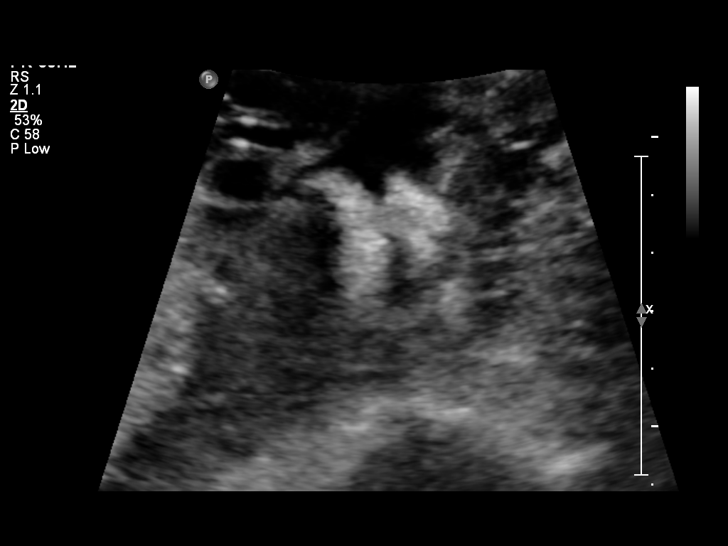
[im 38/74]
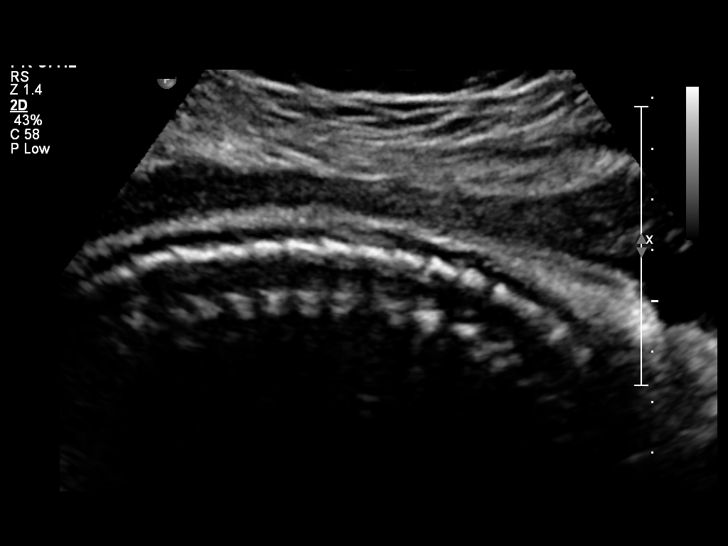
[im 44/74]
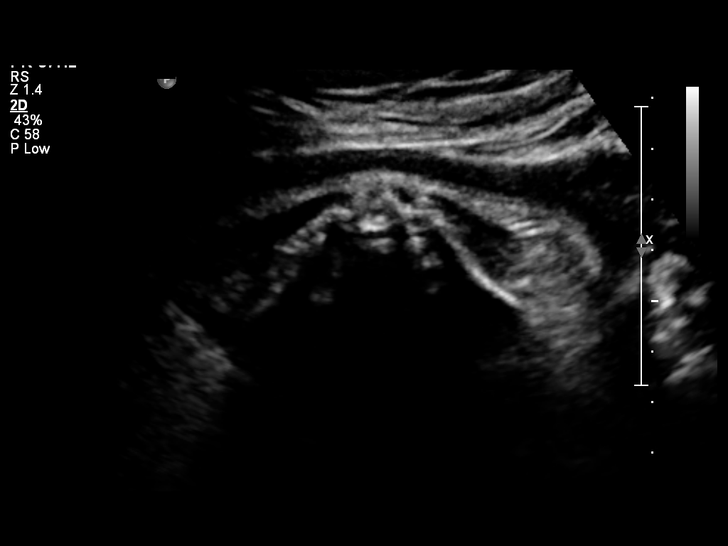
[im 50/74]
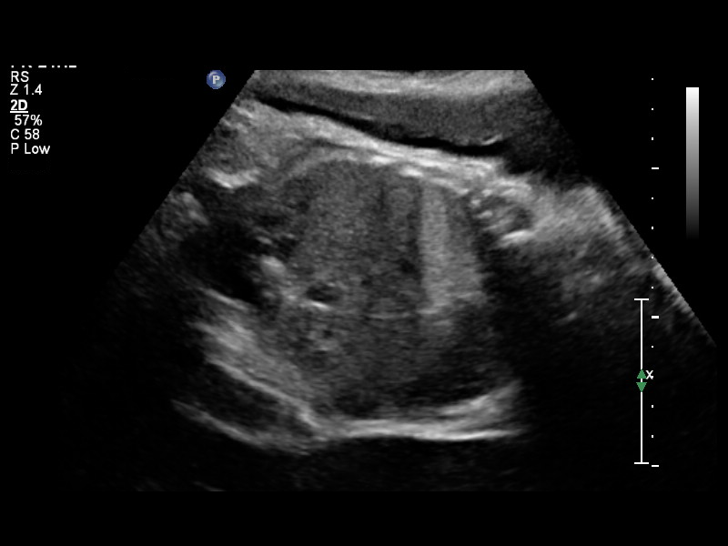
[im 59/74]
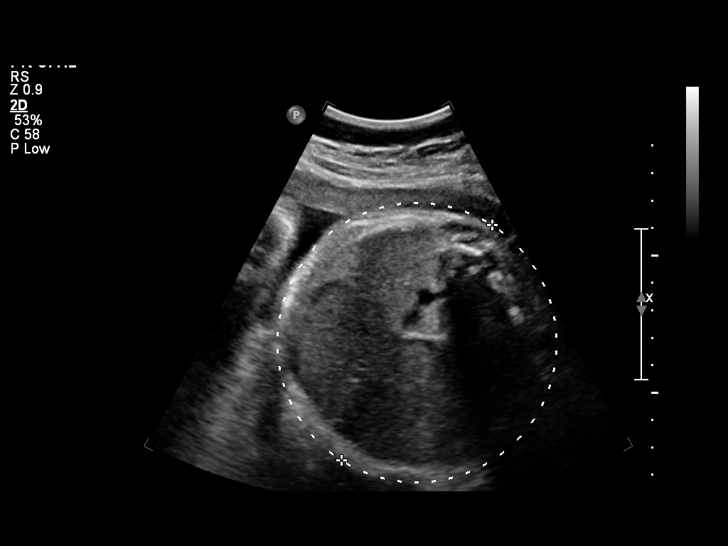
[im 65/74]
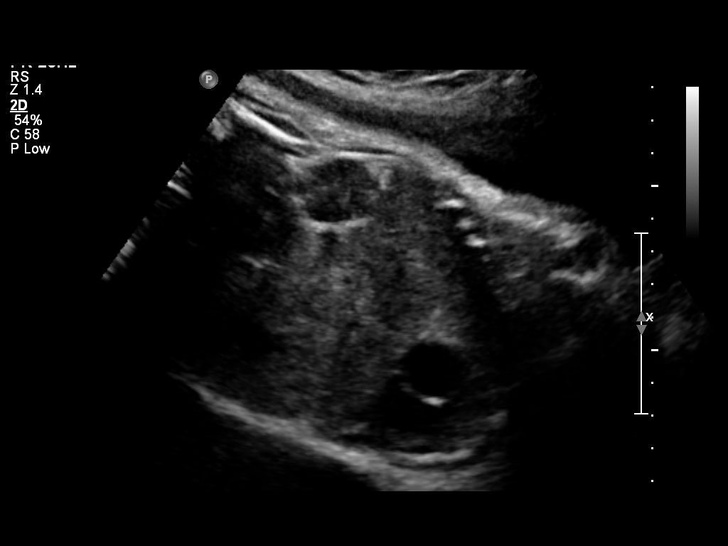
[im 71/74]
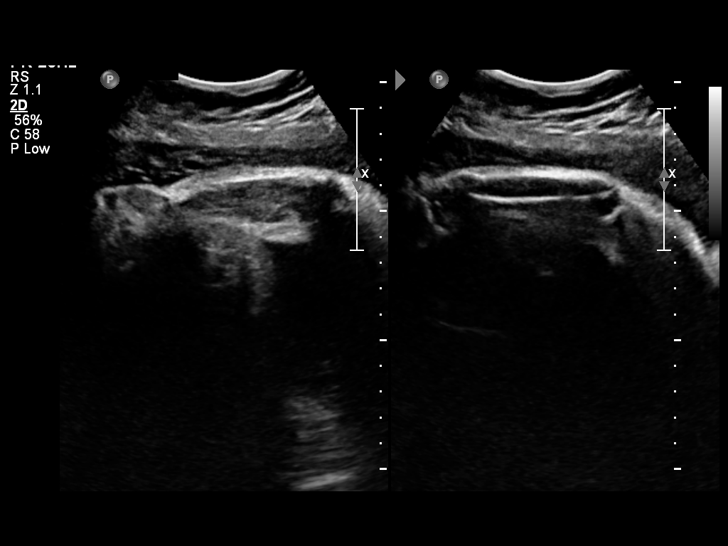

[12 of 28 positions shown; findings below may reference images not displayed]

OBSTETRICS REPORT
                      (Signed Final 02/13/2014 [DATE])

Service(s) Provided

 US OB COMP + 14 WK                                    76805.1
Indications

 34 weeks gestation of pregnancy
 Basic anatomic survey                                 z36
 No or Little Prenatal Care
 Poor obstetric history: Previous macrosomia X 3

 Previous cesarean delivery x 3
Fetal Evaluation

 Num Of Fetuses:    1
 Fetal Heart Rate:  146                          bpm
 Cardiac Activity:  Observed
 Presentation:      Cephalic
 Placenta:          Posterior, above cervical
                    os
 P. Cord            Not well visualized
 Insertion:

 Amniotic Fluid
 AFI FV:      Subjectively within normal limits
 AFI Sum:     11.14   cm       28  %Tile     Larg Pckt:    4.05  cm
 RUQ:   2.47    cm   RLQ:    3.11   cm    LUQ:   1.51    cm   LLQ:    4.05   cm
Biometry

 BPD:     86.1  mm     G. Age:  34w 5d                CI:        76.53   70 - 86
                                                      FL/HC:      20.1   19.4 -

 HC:     311.8  mm     G. Age:  34w 6d       29  %    HC/AC:      1.00   0.96 -

 AC:     313.2  mm     G. Age:  35w 2d       80  %    FL/BPD:     72.8   71 - 87
 FL:      62.7  mm     G. Age:  32w 3d        7  %    FL/AC:      20.0   20 - 24

 Est. FW:    8686  gm      5 lb 6 oz     62  %
Gestational Age

 LMP:           34w 4d        Date:  06/16/13                 EDD:   03/23/14
 U/S Today:     34w 2d                                        EDD:   03/25/14
 Best:          34w 2d     Det. By:  U/S (02/13/14)           EDD:   03/25/14
Anatomy

 Cranium:          Appears normal         Aortic Arch:      Appears normal
 Fetal Cavum:      Appears normal         Ductal Arch:      Not well visualized
 Ventricles:       Appears normal         Diaphragm:        Appears normal
 Choroid Plexus:   Appears normal         Stomach:          Appears normal, left
                                                            sided
 Cerebellum:       Appears normal         Abdomen:          Appears normal
 Posterior Fossa:  Not well visualized    Abdominal Wall:   Not well visualized
 Nuchal Fold:      Not applicable (>20    Cord Vessels:     Appears normal (3
                   wks GA)                                  vessel cord)
 Face:             Profile nl; orbits not Kidneys:          Appear normal
                   well visualized
 Lips:             Appears normal         Bladder:          Appears normal
 Heart:            Not well visualized    Spine:            Appears normal
 RVOT:             Not well visualized    Lower             Visualized
                                          Extremities:
 LVOT:             Not well visualized    Upper             Visualized
                                          Extremities:

 Other:  Nasal bone visualized. Technically difficult due to advanced GA and
         fetal position.
Cervix Uterus Adnexa

 Cervix:       Not visualized (advanced GA >14wks)
Impression

 SIUP at 34+2 weeks
 Normal detailed fetal anatomy; limited views of heart, PF and
 CI
 Normal amniotic fluid volume
 EDC based on today's measurements
Recommendations

 Follow-up as clinically indicated or could attempt to complete
 anatomic survey in 3-4 weeks

## 2016-09-16 ENCOUNTER — Encounter: Payer: Self-pay | Admitting: Pediatric Intensive Care

## 2016-10-19 NOTE — Congregational Nurse Program (Signed)
Congregational Nurse Program Note  Date of Encounter: 09/16/2016  Past Medical History: Past Medical History:  Diagnosis Date  . Medical history non-contributory   . Postprocedural pelvic peritoneal adhesions    4 prior c/s    Encounter Details:  Via interpreter Dorie- client has week history of left shoulder pain and back pain. 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 CN recommended heat and increase ibuprofen OTC dose as client was only taking 1-2 ibuprofen every days. CN recommended follow up in clinic and advised establish PCP for appointment. Information regarding [employer] program given via Joselyn Glassmanyler FAI case worker. No questions at present.

## 2016-11-02 ENCOUNTER — Ambulatory Visit (HOSPITAL_COMMUNITY): Payer: Medicaid Other

## 2016-11-08 ENCOUNTER — Emergency Department (HOSPITAL_COMMUNITY)
Admission: EM | Admit: 2016-11-08 | Discharge: 2016-11-08 | Disposition: A | Discharge status: Home / Self Care | Payer: Self-pay | Attending: Emergency Medicine | Admitting: Emergency Medicine

## 2016-11-08 ENCOUNTER — Encounter (HOSPITAL_COMMUNITY): Payer: Self-pay | Admitting: Emergency Medicine

## 2016-11-08 DIAGNOSIS — Y939 Activity, unspecified: Secondary | ICD-10-CM | POA: Insufficient documentation

## 2016-11-08 DIAGNOSIS — Z23 Encounter for immunization: Secondary | ICD-10-CM | POA: Insufficient documentation

## 2016-11-08 DIAGNOSIS — S61412A Laceration without foreign body of left hand, initial encounter: Secondary | ICD-10-CM | POA: Insufficient documentation

## 2016-11-08 DIAGNOSIS — W260XXA Contact with knife, initial encounter: Secondary | ICD-10-CM | POA: Insufficient documentation

## 2016-11-08 DIAGNOSIS — Y929 Unspecified place or not applicable: Secondary | ICD-10-CM | POA: Insufficient documentation

## 2016-11-08 DIAGNOSIS — Y999 Unspecified external cause status: Secondary | ICD-10-CM | POA: Insufficient documentation

## 2016-11-08 MED ORDER — LIDOCAINE HCL (PF) 1 % IJ SOLN
5.0000 mL | Freq: Once | INTRAMUSCULAR | Status: AC
  Administered 2016-11-08: 5 mL
  Filled 2016-11-08: qty 5

## 2016-11-08 MED ORDER — BACITRACIN ZINC 500 UNIT/GM EX OINT
TOPICAL_OINTMENT | Freq: Once | CUTANEOUS | Status: AC
  Administered 2016-11-08: 1 via TOPICAL

## 2016-11-08 MED ORDER — TETANUS-DIPHTH-ACELL PERTUSSIS 5-2.5-18.5 LF-MCG/0.5 IM SUSP
0.5000 mL | Freq: Once | INTRAMUSCULAR | Status: AC
  Administered 2016-11-08: 0.5 mL via INTRAMUSCULAR
  Filled 2016-11-08: qty 0.5

## 2016-11-08 NOTE — ED Triage Notes (Addendum)
Patient accidentally sliced her left hand with a knife while cutting watermelon this evening , presents with approx.1" laceration at left hand with minimal bleeding - dressing applied at triage . Spanish interpreter service utilized during encounter.

## 2016-11-08 NOTE — ED Provider Notes (Signed)
[facility]-EMER[facility]Y DEPT Provider Note   CSN: [id number] Arrival date & time: 11/08/16  1936   By signing my name below, I, Clarisse GougeXavier Herndon, attest that this documentation has been prepared under the direction and in the presence of Kerrie BuffaloHope Neese, NP. Electronically signed, Clarisse GougeXavier Herndon, ED Scribe. 11/08/16. 9:06 PM.   History   Chief Complaint Chief Complaint  Patient presents with  . Laceration   The history is provided by the patient and medical records. A language interpreter was used.    Shelia Ford is a 26 y.o. female presenting to the Emergency Department concerning a L index laceration she sustained ~2 hours prior to evaluation. States she was cutting watermelon when the knife slipped and she cut herself. Mild associated sensation loss to affected finger noted. She described 6/10, aching constant pain worse with flexing the finger in triage. Dressing applied prior to evaluation; bleeding controlled on evaluation. Last tetanus unknown. No fever or chills. No other complaints at this time.   History reviewed. No pertinent past medical history.  There are no active problems to display for this patient.   History reviewed. No pertinent surgical history.  OB History    No data available       Home Medications    Prior to Admission medications   Not on File    Family History No family history on file.  Social History Social History  Substance Use Topics  . Smoking status: Never Smoker  . Smokeless tobacco: Never Used  . Alcohol use No     Allergies   Patient has no known allergies.   Review of Systems Review of Systems  Constitutional: Negative for chills and fever.  Gastrointestinal: Negative for nausea and vomiting.  Musculoskeletal: Positive for arthralgias.       Left hand  Skin: Positive for wound. Negative for color change.     Physical Exam Updated Vital Signs BP (!) 130/92 (BP Location: Left Arm)   Pulse 85   Temp 99 F (37.2 C)  (Oral)   Resp 18   LMP 10/08/2016   SpO2 100%   Physical Exam  Constitutional: She appears well-developed and well-nourished. No distress.  HENT:  Head: Normocephalic.  Eyes: EOM are normal.  Neck: Neck supple.  Cardiovascular: Normal rate.   Pulmonary/Chest: Effort normal.  Musculoskeletal: Normal range of motion.       Left hand: She exhibits tenderness and laceration. She exhibits normal range of motion, no bony tenderness and normal capillary refill. Normal strength noted. She exhibits no thumb/finger opposition.       Hands:  Adequate circulation.  Neurological: She is alert.  Skin: Skin is warm and dry. Laceration noted.  3 cm laceration to the L hand, palmar aspect, at the base of the index finger.  Psychiatric: She has a normal mood and affect. Her behavior is normal.  Nursing note and vitals reviewed.    ED Treatments / Results  DIAGNOSTIC STUDIES: Oxygen Saturation is 100% on RA, NL by my interpretation.    COORDINATION OF CARE: 9:00 PM-Discussed next steps with pt. Pt verbalized understanding and is agreeable with the plan. Pt prepared for laceration repair.   Labs (all labs ordered are listed, but only abnormal results are displayed) Labs Reviewed - No data to display  Radiology No results found.  Procedures .Marland Kitchen.Laceration Repair Date/Time: 11/08/2016 9:33 PM Performed by: Janne NapoleonNEESE, HOPE M Authorized by: Janne NapoleonNEESE, HOPE M   Consent:    Consent obtained:  Verbal   Consent given  by:  Patient   Risks discussed:  Infection, need for additional repair and nerve damage   Alternatives discussed:  Referral Anesthesia (see MAR for exact dosages):    Anesthesia method:  Local infiltration   Local anesthetic:  Lidocaine 1% w/o epi Laceration details:    Location:  Hand   Hand location:  L palm   Length (cm):  3 Repair type:    Repair type:  Simple Pre-procedure details:    Preparation:  Patient was prepped and draped in usual sterile fashion and imaging obtained to  evaluate for foreign bodies Exploration:    Wound exploration: wound explored through full range of motion and entire depth of wound probed and visualized     Contaminated: no   Treatment:    Area cleansed with:  Saline and Betadine   Amount of cleaning:  Standard   Irrigation solution:  Sterile saline   Irrigation method:  Syringe   Visualized foreign bodies/material removed: no   Skin repair:    Repair method:  Sutures   Suture size:  5-0   Suture material:  Prolene   Suture technique:  Simple interrupted   Number of sutures:  4 Approximation:    Approximation:  Close Post-procedure details:    Dressing:  Antibiotic ointment and sterile dressing   Patient tolerance of procedure:  Tolerated well, no immediate complications    (including critical care time)  Medications Ordered in ED Medications  Tdap (BOOSTRIX) injection 0.5 mL (0.5 mLs Intramuscular Given 11/08/16 2119)  lidocaine (PF) (XYLOCAINE) 1 % injection 5 mL (5 mLs Infiltration Given 11/08/16 2119)  bacitracin ointment (1 application Topical Given 11/08/16 2208)     Initial Impression / Assessment and Plan / ED Course  I have reviewed the triage vital signs and the nursing notes.  Tetanus updated in ED. Laceration occurred < 12 hours prior to repair. Discussed laceration care with pt and answered questions. Pt to f-u for suture removal in 7 days and wound check sooner should there be signs of dehiscence or infection. Pt is hemodynamically stable with no complaints prior to dc.      Final Clinical Impressions(s) / ED Diagnoses   Final diagnoses:  Laceration without foreign body of left hand, initial encounter    New Prescriptions There are no discharge medications for this patient. I personally performed the services described in this documentation, which was scribed in my presence. The recorded information has been reviewed and is accurate.    Kerrie Buffalo [place], [place] 11/09/16 Moses Manners    Lorre Nick, MD 11/10/16  [phone]

## 2016-11-08 NOTE — ED Notes (Signed)
ED Provider at bedside. 

## 2016-11-15 ENCOUNTER — Emergency Department (HOSPITAL_COMMUNITY)
Admission: EM | Admit: 2016-11-15 | Discharge: 2016-11-15 | Disposition: A | Discharge status: Home / Self Care | Payer: Self-pay | Attending: Physician Assistant | Admitting: Physician Assistant

## 2016-11-15 DIAGNOSIS — Z4802 Encounter for removal of sutures: Secondary | ICD-10-CM

## 2016-11-15 DIAGNOSIS — S61211D Laceration without foreign body of left index finger without damage to nail, subsequent encounter: Secondary | ICD-10-CM | POA: Insufficient documentation

## 2016-11-15 DIAGNOSIS — W260XXD Contact with knife, subsequent encounter: Secondary | ICD-10-CM | POA: Insufficient documentation

## 2016-11-15 NOTE — ED Provider Notes (Signed)
[facility] Provider Note   CSN: [id number] Arrival date & time: 11/15/16  1551  By signing my name below, I, Deland Pretty, attest that this documentation has been prepared under the direction and in the presence of Ciana Simmon, PA-C. Electronically Signed: Deland Pretty, ED Scribe. 11/15/16. 6:57 PM.  History   Chief Complaint Chief Complaint  Patient presents with  . Suture / Staple Removal   The history is provided by the patient. A language interpreter was used.   HPI Comments: Shelia Ford is a 26 y.o. female who presents to the Emergency Department for suture removal that were placed on her left index finger one week ago. The pt states that she was cutting watermelon when she slipped and cut herself. The pt denies pain and drainage. She also denies chills, fever, nausea and vomiting.   No past medical history on file.  There are no active problems to display for this patient.   No past surgical history on file.  OB History    No data available       Home Medications    Prior to Admission medications   Not on File    Family History No family history on file.  Social History Social History  Substance Use Topics  . Smoking status: Never Smoker  . Smokeless tobacco: Never Used  . Alcohol use No     Allergies   Patient has no known allergies.   Review of Systems Review of Systems  Constitutional: Negative for chills and fever.  Gastrointestinal: Negative for nausea and vomiting.  Skin: Positive for wound.     Physical Exam Updated Vital Signs BP 127/76 (BP Location: Right Arm)   Pulse 87   Temp 98.6 F (37 C) (Oral)   Resp 18   SpO2 100%   Physical Exam  Constitutional: She appears well-developed and well-nourished. No distress.  HENT:  Head: Normocephalic and atraumatic.  Eyes: Conjunctivae and EOM are normal. No scleral icterus.  Neck: Normal range of motion.  Pulmonary/Chest: Effort normal. No respiratory  distress.  Neurological: She is alert.  Skin: No rash noted. She is not diaphoretic.  3 cm healed linear laceration on the left hand below the left palm of the index finger. No drainage or bleeding noted. No surrounding cellulitis or signs of infection. Full ROM of digits  Psychiatric: She has a normal mood and affect.  Nursing note and vitals reviewed.    ED Treatments / Results   DIAGNOSTIC STUDIES: Oxygen Saturation is 99% on RA, normal by my interpretation.   COORDINATION OF CARE: 6:45 PM-Discussed next steps with pt. Pt verbalized understanding and is agreeable with the plan.   Labs (all labs ordered are listed, but only abnormal results are displayed) Labs Reviewed - No data to display  EKG  EKG Interpretation None       Radiology No results found.  Procedures .Suture Removal Date/Time: 11/15/2016 6:49 PM Performed by: Dietrich Pates Authorized by: Bary Castilla LYN   Consent:    Consent obtained:  Verbal   Consent given by:  Patient   Risks discussed:  Pain and bleeding   Alternatives discussed:  Delayed treatment Location:    Location:  Upper extremity   Upper extremity location:  Hand   Hand location:  L hand Procedure details:    Wound appearance:  No signs of infection   Number of sutures removed:  4   Number of staples removed:  0 Post-procedure details:    Post-removal:  No dressing applied   Patient tolerance of procedure:  Tolerated well, no immediate complications    (including critical care time)  Medications Ordered in ED Medications - No data to display   Initial Impression / Assessment and Plan / ED Course  I have reviewed the triage vital signs and the nursing notes.  Pertinent labs & imaging results that were available during my care of the patient were reviewed by me and considered in my medical decision making (see chart for details).     Patient presents to ED for suture removal of left hand. Sutures were placed 1 week  ago. 4 sutures were removed here in the ED. There are no signs of infection, drainage or bleeding present. She continues to have full range of motion of the digits and hand. Patient denies any fever, nausea, vomiting or other complaints at this time. She'll return precautions given.  Final Clinical Impressions(s) / ED Diagnoses   Final diagnoses:  Visit for suture removal    New Prescriptions There are no discharge medications for this patient.  I personally performed the services described in this documentation, which was scribed in my presence. The recorded information has been reviewed and is accurate.    Dietrich PatesKhatri, Noah Pelaez, PA-C 11/15/16 1919    Abelino DerrickMackuen, Courteney Lyn, MD 11/15/16 1920

## 2016-11-15 NOTE — ED Notes (Signed)
Pt verbalized understanding of d/c instructions and has no further questions. Pt is stable, A&Ox4, VSS.  

## 2016-11-15 NOTE — ED Triage Notes (Addendum)
Needs sutures out left index finger placed 7/2 area looks well healed no s/s of infection

## 2016-11-25 ENCOUNTER — Other Ambulatory Visit (HOSPITAL_COMMUNITY): Payer: Self-pay | Admitting: *Deleted

## 2016-11-25 ENCOUNTER — Ambulatory Visit (HOSPITAL_COMMUNITY)
Admission: RE | Admit: 2016-11-25 | Discharge: 2016-11-25 | Disposition: A | Discharge status: Home / Self Care | Payer: Self-pay | Source: Ambulatory Visit | Attending: Obstetrics and Gynecology | Admitting: Obstetrics and Gynecology

## 2016-11-25 ENCOUNTER — Encounter (HOSPITAL_COMMUNITY): Payer: Self-pay | Admitting: *Deleted

## 2016-11-25 VITALS — BP 120/84 | Temp 98.0°F | Ht 60.0 in | Wt 168.8 lb

## 2016-11-25 DIAGNOSIS — N644 Mastodynia: Secondary | ICD-10-CM

## 2016-11-25 DIAGNOSIS — Z1239 Encounter for other screening for malignant neoplasm of breast: Secondary | ICD-10-CM

## 2016-11-25 DIAGNOSIS — R87612 Low grade squamous intraepithelial lesion on cytologic smear of cervix (LGSIL): Secondary | ICD-10-CM

## 2016-11-25 NOTE — Patient Instructions (Signed)
Explained breast self awareness with Shelia Ford. Patient did not need a Pap smear today due to last Pap smear was 10/01/2016. Explained the colposcopy the recommended follow up for her abnormal Pap smear. [facility]. Appointment scheduled for Wednesday, December 01, 2016 at 1440. [facility]. Appointment scheduled for Monday, November 29, 2016 at 1500. Patient aware of appointments and will be there. Shelia Ford verbalized understanding.  Shelia Ford, Shelia Maserhristine Poll, RN 1:32 PM

## 2016-11-25 NOTE — Progress Notes (Signed)
[facility] due to having an abnormal Pap smear 10/01/2016 that a colposcopy is recommended for follow up. Complaints of left breast pain that comes and goes x 1 month. Patient rates the pain at a 5 out of 10..   Pap Smear:  Pap smear not completed today. [facility]. [facility] for a colps[facility]. Appointment scheduled for Wednesday, December 01, 2016 at 1440. Per patient has no history of abnormal Pap smear prior to the most recent Pap smear. Last Pap smear result is in EPIC.  Physical exam: Breasts Breasts symmetrical. No skin abnormalities bilateral breasts. No nipple retraction bilateral breasts. Patient is currently breastfeeding. No lymphadenopathy. No lumps palpated bilateral breasts. Complaints of pain left breast at nipple area on exam. [facility] ultrasound. Appointment scheduled for Monday, November 29, 2016 at 1500.       Pelvic/Bimanual No Pap smear completed today since last Pap smear was 10/01/2016. Pap smear not indicated per BCCCP guidelines.   Smoking History: Patient has never smoked.  Patient Navigation: Patient education provided. Access to services provided for patient through [facility] program. Spanish interpreter provided.  Used Spanish interpreter [employer] Lindner from [employer].

## 2016-11-26 ENCOUNTER — Encounter (HOSPITAL_COMMUNITY): Payer: Self-pay | Admitting: *Deleted

## 2016-11-29 ENCOUNTER — Ambulatory Visit
Admission: RE | Admit: 2016-11-29 | Discharge: 2016-11-29 | Disposition: A | Discharge status: Home / Self Care | Payer: No Typology Code available for payment source | Source: Ambulatory Visit | Attending: Obstetrics and Gynecology | Admitting: Obstetrics and Gynecology

## 2016-11-29 DIAGNOSIS — N644 Mastodynia: Secondary | ICD-10-CM

## 2016-12-01 ENCOUNTER — Ambulatory Visit (INDEPENDENT_AMBULATORY_CARE_PROVIDER_SITE_OTHER): Payer: Self-pay | Admitting: Family Medicine

## 2016-12-01 VITALS — BP 129/77 | HR 100 | Wt 167.1 lb

## 2016-12-01 DIAGNOSIS — R87612 Low grade squamous intraepithelial lesion on cytologic smear of cervix (LGSIL): Secondary | ICD-10-CM

## 2016-12-01 LAB — POCT PREGNANCY, URINE: Preg Test, Ur: NEGATIVE

## 2016-12-01 NOTE — Progress Notes (Signed)
    COLPOSCOPY PROCEDURE NOTE  26 y.o. 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  Placed in lithotomy position. Cervix viewed with speculum and colposcope after application of acetic acid.   Colposcopy adequate? No; unable to visualize entirety of cervical  Findings: no visible lesions, no mosaicism, no punctation and no abnormal vasculature; corresponding biopsies obtained.   ECC specimen obtained. And random biopsies taken at 1 and 9 o'clock All specimens were labeled and sent to pathology.   Patient was given post procedure instructions.  Will follow up pathology and manage accordingly; patient will be contacted with results and recommendations.  Routine preventative health maintenance measures emphasized.  Dr. Shawnie PonsPratt was present and supervised procedure.  Raynelle FanningJulie P. Lilyian Quayle, MD OB Fellow

## 2016-12-02 ENCOUNTER — Other Ambulatory Visit (HOSPITAL_COMMUNITY)
Admission: RE | Admit: 2016-12-02 | Discharge: 2016-12-02 | Disposition: A | Discharge status: Home / Self Care | Payer: Self-pay | Source: Ambulatory Visit | Attending: Family Medicine | Admitting: Family Medicine

## 2016-12-02 NOTE — Addendum Note (Signed)
Addended by: Clearnce SorrelPICKARD, Lexus Barletta S on: 12/02/2016 02:18 PM   Modules accepted: Orders

## 2016-12-09 ENCOUNTER — Encounter: Payer: Self-pay | Admitting: Lab

## 2016-12-09 ENCOUNTER — Telehealth: Payer: Self-pay | Admitting: Lab

## 2016-12-09 NOTE — Telephone Encounter (Signed)
  Attempted to contact patient 2 times, the number that we have on file does not work. Will send a letter.

## 2018-07-28 ENCOUNTER — Other Ambulatory Visit (HOSPITAL_COMMUNITY): Payer: Self-pay | Admitting: *Deleted

## 2018-07-28 DIAGNOSIS — N644 Mastodynia: Secondary | ICD-10-CM

## 2018-09-04 ENCOUNTER — Telehealth (HOSPITAL_COMMUNITY): Payer: Self-pay | Admitting: *Deleted

## 2018-09-04 NOTE — Telephone Encounter (Signed)
Telephoned patient at home number and confirmed BCCCP appointment for April 28. No symptoms of COVID-19. No contact with someone with a confirmed diagnosis of COVID-19. No travel outside of [place] in the past 14 days. Used interpreter Delorise Royals.

## 2018-09-05 ENCOUNTER — Ambulatory Visit (HOSPITAL_COMMUNITY)
Admission: RE | Admit: 2018-09-05 | Discharge: 2018-09-05 | Disposition: A | Discharge status: Home / Self Care | Payer: Self-pay | Source: Ambulatory Visit | Attending: Obstetrics and Gynecology | Admitting: Obstetrics and Gynecology

## 2018-09-05 ENCOUNTER — Encounter (HOSPITAL_COMMUNITY): Payer: Self-pay

## 2018-09-05 ENCOUNTER — Other Ambulatory Visit: Payer: Self-pay

## 2018-09-05 VITALS — BP 128/82 | Temp 97.6°F | Wt 165.0 lb

## 2018-09-05 DIAGNOSIS — N644 Mastodynia: Secondary | ICD-10-CM

## 2018-09-05 DIAGNOSIS — Z1239 Encounter for other screening for malignant neoplasm of breast: Secondary | ICD-10-CM

## 2018-09-05 HISTORY — DX: Type 2 diabetes mellitus without complications: E11.9

## 2018-09-05 HISTORY — DX: Hyperlipidemia, unspecified: E78.5

## 2018-09-05 NOTE — Progress Notes (Signed)
Complaints of left breast pain around the nipple area since June 2018 that comes and goes. Patient rated the pain at a 6 out of 10.  Pap Smear: Pap smear not completed today. Last Pap smear was in August 2019 at the [facility] and normal per patient. 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 Last Pap smear result is not in Epic. Previous Pap smear and colposcopy result above is in Epic.  Physical exam: Breasts Breasts symmetrical. No skin abnormalities bilateral breasts. No nipple retraction bilateral breasts. No nipple discharge bilateral breasts. No lymphadenopathy. No lumps palpated bilateral breasts. No complaints of pain or tenderness on exam today. Ref[facility] patient [facility] the [facility] for a left breast ultrasound. Appointment scheduled for Thursday, September 07, 2018 at 1410.        Pelvic/Bimanual No Pap smear completed today since last Pap smear was in August 2019 per patient. Pap smear not indicated per BCCCP guidelines.   Smoking History: Patient has never smoked.  Patient Navigation: Patient education provided. Access to services provided for patient through [facility] program. Spanish interpreter provided.   Breast and Cervical Cancer Risk Assessment: Patient has no family history of breast cancer, known genetic mutations, or radiation treatment to the chest before age 50. Patient has a history of cervical dysplasia. Patient has no history of being immunocompromised and DES exposure in-utero. Breast cancer risk assessment completed. No breast cancer risk calculated due to patient is less than 23 years old.  Used Spanish interpreter [employer] from [place].

## 2018-09-05 NOTE — Patient Instructions (Signed)
Explained breast self awareness with Shelia Ford. Patient did not need a Pap smear today due to last Pap smear was in August 2019 per patient. Let patient know that her next Pap smear is due in August 2020 due to her previous Pap smear was abnormal. Referred patient to [facility] for a left breast ultrasound. Appointment scheduled for Thursday, September 07, 2018 at 1410. Patient aware of appointment and will be there. Shelia Ford verbalized understanding.  Adriona Kaney, Kathaleen Maser, RN 2:08 PM

## 2018-09-07 ENCOUNTER — Ambulatory Visit
Admission: RE | Admit: 2018-09-07 | Discharge: 2018-09-07 | Disposition: A | Discharge status: Home / Self Care | Payer: No Typology Code available for payment source | Source: Ambulatory Visit | Attending: Obstetrics and Gynecology | Admitting: Obstetrics and Gynecology

## 2018-09-07 ENCOUNTER — Other Ambulatory Visit: Payer: Self-pay

## 2018-09-07 DIAGNOSIS — N644 Mastodynia: Secondary | ICD-10-CM

## 2018-09-22 ENCOUNTER — Encounter (HOSPITAL_COMMUNITY): Payer: Self-pay | Admitting: *Deleted

## 2018-10-21 ENCOUNTER — Emergency Department (HOSPITAL_COMMUNITY): Payer: HRSA Program

## 2018-10-21 ENCOUNTER — Other Ambulatory Visit: Payer: Self-pay

## 2018-10-21 ENCOUNTER — Emergency Department (HOSPITAL_COMMUNITY)
Admission: EM | Admit: 2018-10-21 | Discharge: 2018-10-21 | Disposition: A | Discharge status: Home / Self Care | Payer: HRSA Program | Attending: Emergency Medicine | Admitting: Emergency Medicine

## 2018-10-21 ENCOUNTER — Encounter (HOSPITAL_COMMUNITY): Payer: Self-pay | Admitting: *Deleted

## 2018-10-21 DIAGNOSIS — E119 Type 2 diabetes mellitus without complications: Secondary | ICD-10-CM | POA: Insufficient documentation

## 2018-10-21 DIAGNOSIS — R52 Pain, unspecified: Secondary | ICD-10-CM

## 2018-10-21 DIAGNOSIS — Z7984 Long term (current) use of oral hypoglycemic drugs: Secondary | ICD-10-CM | POA: Insufficient documentation

## 2018-10-21 DIAGNOSIS — R05 Cough: Secondary | ICD-10-CM | POA: Diagnosis not present

## 2018-10-21 DIAGNOSIS — M791 Myalgia, unspecified site: Secondary | ICD-10-CM | POA: Diagnosis present

## 2018-10-21 DIAGNOSIS — U071 COVID-19: Secondary | ICD-10-CM | POA: Insufficient documentation

## 2018-10-21 DIAGNOSIS — H9203 Otalgia, bilateral: Secondary | ICD-10-CM | POA: Insufficient documentation

## 2018-10-21 DIAGNOSIS — R6883 Chills (without fever): Secondary | ICD-10-CM | POA: Diagnosis not present

## 2018-10-21 LAB — GROUP A STREP BY PCR: Group A Strep by PCR: NOT DETECTED

## 2018-10-21 MED ORDER — BENZONATATE 100 MG PO CAPS: 100.0000 mg | ORAL_CAPSULE | Freq: Three times a day (TID) | ORAL | 0 refills | Status: DC

## 2018-10-21 MED ORDER — FLUTICASONE PROPIONATE 50 MCG/ACT NA SUSP: 1.0000 | Freq: Every day | NASAL | 0 refills | Status: DC

## 2018-10-21 MED ORDER — ACETAMINOPHEN ER 650 MG PO TBCR: 650.0000 mg | EXTENDED_RELEASE_TABLET | Freq: Three times a day (TID) | ORAL | 0 refills | Status: AC | PRN

## 2018-10-21 MED ORDER — ACETAMINOPHEN 325 MG PO TABS
650.0000 mg | ORAL_TABLET | Freq: Once | ORAL | Status: AC
  Administered 2018-10-21: 650 mg via ORAL
  Filled 2018-10-21: qty 2

## 2018-10-21 NOTE — Discharge Instructions (Addendum)
You were seen in the emergency today for body aches cough, chest pain, and URI symptoms. Your chest xray was normal. Your strep was negative. We are sending you home with the following medicines:   -Flonase to be used 1 spray in each nostril daily.  This medication is used to treat your congestion.  -Tessalon can be taken once every 8 hours as needed.  This medication is used to treat your cough.  -Tylenol- to take every 8 hours as needed for pain & fever.   We have prescribed you new medication(s) today. Discuss the medications prescribed today with your pharmacist as they can have adverse effects and interactions with your other medicines including over the counter and prescribed medications. Seek medical evaluation if you start to experience new or abnormal symptoms after taking one of these medicines, seek care immediately if you start to experience difficulty breathing, feeling of your throat closing, facial swelling, or rash as these could be indications of a more serious allergic reaction  We are testing your for coronavirus. We ask people being tested that they quarantine themselves for 14 days. You may be able to discontinue self quarantine if the following conditions are met:   Persons with COVID-19 who have symptoms and were directed to care for themselves at home may discontinue home isolation under the  following conditions: - It has been at least 7 days have passed since symptoms first appeared. - AND at least 3 days (72 hours) have passed since recovery defined as resolution of fever without the use of fever-reducing medications and improvement in respiratory symptoms (e.g., cough, shortness of breath)  Please follow the below quarantine instructions.   Please follow up with primary care within 3-5 days for re-evaluation- call prior to going to the office to make them aware of your symptoms. Return to the ER for new or worsening symptoms including but not limited to increased work of  breathing, fever, chest pain, passing out, or any other concerns.       Person Under Monitoring Name: Shelia Ford  Location: [place] [address]   Infection Prevention Recommendations for Individuals Confirmed to have, or Being Evaluated for, 2019 Novel Coronavirus (COVID-19) Infection Who Receive Care at Home  Individuals who are confirmed to have, or are being evaluated for, COVID-19 should follow the prevention steps below until a healthcare provider or local or state health department says they can return to normal activities.  Stay home except to get medical care You should restrict activities outside your home, except for getting medical care. Do not go to work, school, or public areas, and do not use public transportation or taxis.  Call ahead before visiting your doctor Before your medical appointment, call the healthcare provider and tell them that you have, or are being evaluated for, COVID-19 infection. This will help the healthcare providers office take steps to keep other people from getting infected. Ask your healthcare provider to call the local or state health department.  Monitor your symptoms Seek prompt medical attention if your illness is worsening (e.g., difficulty breathing). Before going to your medical appointment, call the healthcare provider and tell them that you have, or are being evaluated for, COVID-19 infection. Ask your healthcare provider to call the local or state health department.  Wear a facemask You should wear a facemask that covers your nose and mouth when you are in the same room with other people and when you visit a healthcare provider. People who  live with or visit you should also wear a facemask while they are in the same room with you.  Separate yourself from other people in your home As much as possible, you should stay in a different room from other people in your home. Also, you should use a  separate bathroom, if available.  Avoid sharing household items You should not share dishes, drinking glasses, cups, eating utensils, towels, bedding, or other items with other people in your home. After using these items, you should wash them thoroughly with soap and water.  Cover your coughs and sneezes Cover your mouth and nose with a tissue when you cough or sneeze, or you can cough or sneeze into your sleeve. Throw used tissues in a lined trash can, and immediately wash your hands with soap and water for at least 20 seconds or use an alcohol-based hand rub.  Wash your [facility] your hands often and thoroughly with soap and water for at least 20 seconds. You can use an alcohol-based hand sanitizer if soap and water are not available and if your hands are not visibly dirty. Avoid touching your eyes, nose, and mouth with unwashed hands.   Prevention Steps for Caregivers and Household Members of Individuals Confirmed to have, or Being Evaluated for, COVID-19 Infection Being Cared for in the Home  If you live with, or provide care at home for, a person confirmed to have, or being evaluated for, COVID-19 infection please follow these guidelines to prevent infection:  Follow healthcare providers instructions Make sure that you understand and can help the patient follow any healthcare provider instructions for all care.  Provide for the patients basic needs You should help the patient with basic needs in the home and provide support for getting groceries, prescriptions, and other personal needs.  Monitor the patients symptoms If they are getting sicker, call his or her medical provider and tell them that the patient has, or is being evaluated for, COVID-19 infection. This will help the healthcare providers office take steps to keep other people from getting infected. Ask the healthcare provider to call the local or state health department.  Limit the number of people who have  contact with the patient If possible, have only one caregiver for the patient. Other household members should stay in another home or place of residence. If this is not possible, they should stay in another room, or be separated from the patient as much as possible. Use a separate bathroom, if available. Restrict visitors who do not have an essential need to be in the home.  Keep older adults, very young children, and other sick people away from the patient Keep older adults, very young children, and those who have compromised immune systems or chronic health conditions away from the patient. This includes people with chronic heart, lung, or kidney conditions, diabetes, and cancer.  Ensure good ventilation Make sure that shared spaces in the home have good air flow, such as from an air conditioner or an opened window, weather permitting.  Wash your hands often Wash your hands often and thoroughly with soap and water for at least 20 seconds. You can use an alcohol based hand sanitizer if soap and water are not available and if your hands are not visibly dirty. Avoid touching your eyes, nose, and mouth with unwashed hands. Use disposable paper towels to dry your hands. If not available, use dedicated cloth towels and replace them when they become wet.  Wear a facemask and gloves Wear  a disposable facemask at all times in the room and gloves when you touch or have contact with the patients blood, body fluids, and/or secretions or excretions, such as sweat, saliva, sputum, nasal mucus, vomit, urine, or feces.  Ensure the mask fits over your nose and mouth tightly, and do not touch it during use. Throw out disposable facemasks and gloves after using them. Do not reuse. Wash your hands immediately after removing your facemask and gloves. If your personal clothing becomes contaminated, carefully remove clothing and launder. Wash your hands after handling contaminated clothing. Place all used  disposable facemasks, gloves, and other waste in a lined container before disposing them with other household waste. Remove gloves and wash your hands immediately after handling these items.  Do not share dishes, glasses, or other household items with the patient Avoid sharing household items. You should not share dishes, drinking glasses, cups, eating utensils, towels, bedding, or other items with a patient who is confirmed to have, or being evaluated for, COVID-19 infection. After the person uses these items, you should wash them thoroughly with soap and water.  Wash laundry thoroughly Immediately remove and wash clothes or bedding that have blood, body fluids, and/or secretions or excretions, such as sweat, saliva, sputum, nasal mucus, vomit, urine, or feces, on them. Wear gloves when handling laundry from the patient. Read and follow directions on labels of laundry or clothing items and detergent. In general, wash and dry with the warmest temperatures recommended on the label.  Clean all areas the individual has used often Clean all touchable surfaces, such as counters, tabletops, doorknobs, bathroom fixtures, toilets, phones, keyboards, tablets, and bedside tables, every day. Also, clean any surfaces that may have blood, body fluids, and/or secretions or excretions on them. Wear gloves when cleaning surfaces the patient has come in contact with. Use a diluted bleach solution (e.g., dilute bleach with 1 part bleach and 10 parts water) or a household disinfectant with a label that says EPA-registered for coronaviruses. To make a bleach solution at home, add 1 tablespoon of bleach to 1 quart (4 cups) of water. For a larger supply, add  cup of bleach to 1 gallon (16 cups) of water. Read labels of cleaning products and follow recommendations provided on product labels. Labels contain instructions for safe and effective use of the cleaning product including precautions you should take when applying  the product, such as wearing gloves or eye protection and making sure you have good ventilation during use of the product. Remove gloves and wash hands immediately after cleaning.  Monitor yourself for signs and symptoms of illness Caregivers and household members are considered close contacts, should monitor their health, and will be asked to limit movement outside of the home to the extent possible. Follow the monitoring steps for close contacts listed on the symptom monitoring form.   ? If you have additional questions, contact your local health department or call the epidemiologist on call at [phone] (available 24/7). ? This guidance is subject to change. For the most up-to-date guidance from [facility], please refer to their website: [email or web address]

## 2018-10-21 NOTE — ED Triage Notes (Signed)
Pt states sore throat, body aches x 1 week and yesterday began experiencing sob and chest pain.  Also c/o fevers.

## 2018-10-21 NOTE — ED Triage Notes (Addendum)
Pt speaks spanish. Arrives today complaining of all over bodyaches, fevers, sore throat cough and pain with inspiration for several days. Pt states she was around someone with a positive covid test. She has no one who can watch her child so her child is with her. Pt also states she needs to check her child in after her visit for fever as well.

## 2018-10-21 NOTE — ED Provider Notes (Signed)
[facility] Provider Note   CSN: [id number] Arrival date & time: 10/21/18  1103     History   Chief Complaint Chief Complaint  Patient presents with  . Generalized Body Aches    HPI Shelia Ford is a 28 y.o. female with a history of type 2 diabetes mellitus and hyperlipidemia who presents to the emergency department with complaints of generalized body aches for the past 1 week.  Patient states she has been feeling achy all over with subjective fevers and chills.  She notes associated URI symptoms including congestion, ear pain, and sore throat.  Has had a dry cough, yesterday chest started to hurt when coughing and has been sore since.  The pain is constant, worse with a deep breath, worse with coughing, and worse with palpation.  No alleviating factors to her symptoms.  She has been in close contact with someone who has been positive for COVID-19.  Patient denies shortness of breath to me specifically multiple times despite triage note. Daughter sick w/ similar sxs. Denies dyspnea, hemoptysis, leg swelling, recent travel, recent surgery, recent trauma, prior history of DVT/PE, or prior history of cancer.  Does not utilize hormones. Denies N/V/D or abdominal pain.   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     HPI  Past Medical History:  Diagnosis Date  . Diabetes mellitus without complication ([place])   . Hyperlipidemia   . Medical history non-contributory   . Postprocedural pelvic peritoneal adhesions    4 prior c/s    Patient Active Problem List   Diagnosis Date Noted  . History of low vertical cesarean section 03/24/2014  . 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 Status post repeat low transverse cesarean section 03/23/2014  . Insufficient prenatal care in third trimester 02/08/2014  . History of 3 cesarean sections 02/08/2014  . Glucose intolerance of pregnancy 02/08/2014    Past Surgical History:   Procedure Laterality Date  . CESAREAN SECTION     C/S x 4--low vertical T extension with 4th.  . CESAREAN SECTION N/A 03/22/2014   Procedure: CESAREAN SECTION;  Surgeon: Jonnie Kind, MD;  Location: [facility];  Service: Obstetrics;  Laterality: N/A;     OB History    Gravida  4   Para  4   Term  4   Preterm  0   AB  0   Living  4     SAB  0   TAB  0   Ectopic  0   Multiple      Live Births  4            Home Medications    Prior to Admission medications   Medication Sig Start Date End Date Taking? Authorizing Provider  metFORMIN (GLUCOPHAGE) 500 MG tablet Take by mouth 2 (two) times daily with a meal.    [provider]  oxyCODONE-acetaminophen (PERCOCET/ROXICET) 5-325 MG per tablet Take 1 tablet by mouth every 4 (four) hours as needed (for pain scale less than 7). Patient not taking: Reported on 11/25/2016 03/24/14   Nila Nephew, MD    Family History Family History  Problem Relation Age of Onset  . Diabetes Mother   . Diabetes Maternal Aunt     Social History Social History   Tobacco Use  . Smoking status: Never Smoker  . Smokeless tobacco: Never Used  Substance Use Topics  . Alcohol use: No  . Drug use: No     Allergies  Patient has no known allergies.   Review of Systems Review of Systems  Constitutional: Positive for chills and fever.  HENT: Positive for congestion, ear pain and sore throat.   Respiratory: Positive for cough. Negative for shortness of breath.   Cardiovascular: Positive for chest pain. Negative for palpitations and leg swelling.  Gastrointestinal: Negative for abdominal pain, constipation, diarrhea and vomiting.  Genitourinary: Negative for dysuria.  Musculoskeletal: Positive for myalgias.  Skin: Negative for rash.  Neurological: Negative for syncope.  All other systems reviewed and are negative.    Physical Exam Updated Vital Signs BP (!) 139/103 (BP Location: Right Arm)   Pulse 99   Temp 99.1 F  (37.3 C) (Oral)   Resp 16   Ht 5\' 3"  (1.6 m)   Wt 72.6 kg   LMP 08/14/2018   SpO2 97%   BMI 28.34 kg/m   Physical Exam Vitals signs and nursing note reviewed.  Constitutional:      General: She is not in acute distress.    Appearance: She is well-developed.  HENT:     Head: Normocephalic and atraumatic.     Right Ear: Ear canal normal. Tympanic membrane is not perforated, erythematous, retracted or bulging.     Left Ear: Ear canal normal. Tympanic membrane is not perforated, erythematous, retracted or bulging.     Ears:     Comments: No mastoid erythema/swelling/tenderness.     Nose: Congestion present.     Right Sinus: No maxillary sinus tenderness or frontal sinus tenderness.     Left Sinus: No maxillary sinus tenderness or frontal sinus tenderness.     Mouth/Throat:     Pharynx: Uvula midline. Posterior oropharyngeal erythema present. No oropharyngeal exudate.     Comments: Posterior oropharynx is symmetric appearing. Patient tolerating own secretions without difficulty. No trismus. No drooling. No hot potato voice. No swelling beneath the tongue, submandibular compartment is soft.  Eyes:     General:        Right eye: No discharge.        Left eye: No discharge.     Conjunctiva/sclera: Conjunctivae normal.     Pupils: Pupils are equal, round, and reactive to light.  Neck:     Musculoskeletal: Normal range of motion and neck supple. No edema or neck rigidity.  Cardiovascular:     Rate and Rhythm: Normal rate and regular rhythm.     Pulses:          Dorsalis pedis pulses are 2+ on the right side and 2+ on the left side.     Heart sounds: No murmur.  Pulmonary:     Effort: Pulmonary effort is normal. No respiratory distress.     Breath sounds: Normal breath sounds. No wheezing, rhonchi or rales.  Chest:     Chest wall: Tenderness (anterior chest wall bilaterally without overlying skin changes or palpable crepitus, patient states this reproduces her pain) present.   Abdominal:     General: There is no distension.     Palpations: Abdomen is soft.     Tenderness: There is no abdominal tenderness.  Musculoskeletal:     Right lower leg: No edema.     Left lower leg: No edema.  Lymphadenopathy:     Cervical: No cervical adenopathy.  Skin:    General: Skin is warm and dry.     Capillary Refill: Capillary refill takes less than 2 seconds.     Findings: No rash.  Neurological:     Mental Status: She  is alert.  Psychiatric:        Behavior: Behavior normal.    ED Treatments / Results  Labs (all labs ordered are listed, but only abnormal results are displayed) Labs Reviewed  GROUP A STREP BY PCR  NOVEL CORONAVIRUS, NAA (HOSPITAL ORDER, SEND-OUT TO REF LAB)    EKG EKG Interpretation  Date/Time:  Saturday October 21 2018 11:24:36 EDT Ventricular Rate:  111 PR Interval:    QRS Duration: 81 QT Interval:  338 QTC Calculation: 460 R Axis:   -91 Text Interpretation:  Age not entered, assumed to be  28 years old for purpose of ECG interpretation Sinus tachycardia Left anterior fascicular block Abnormal R-wave progression, late transition Confirmed by Loren RacerYelverton, David ([id number]) on 10/21/2018 1:58:12 PM   Radiology Dg Chest Port 1 View  Result Date: 10/21/2018 CLINICAL DATA:  Body ache, fevers, and sore throat.  Chest pain. EXAM: PORTABLE CHEST 1 VIEW COMPARISON:  None. FINDINGS: The heart size and mediastinal contours are within normal limits. Both lungs are clear. The visualized skeletal structures are unremarkable. IMPRESSION: No active disease. Electronically Signed   By: Gerome Samavid  Williams III M.D   On: 10/21/2018 13:00    Procedures Procedures (including critical care time)  Medications Ordered in ED Medications  acetaminophen (TYLENOL) tablet 650 mg (650 mg Oral Given 10/21/18 1156)     Initial Impression / Assessment and Plan / ED Course  I have reviewed the triage vital signs and the nursing notes.  Pertinent labs & imaging results that were  available during my care of the patient were reviewed by me and considered in my medical decision making (see chart for details).   Patient presents to the ED w/ generalized body aches, subjective fever, URI sxs, cough, & chest pain. She is nontoxic appearing, in no apparent distress, BP mildly elevated, doubt HTN emergency. Mild intermittent tachycardia, normal rate on my exam. Lungs are CTA, CXR negative for infiltrate, doubt pneumonia. There is no wheezing or signs of respiratory distress. SpO2 maintaining 97-100%, no complaints of dyspnea to me. No sinus tenderness, doubt acute bacterial sinusitis.  Strep negative. No evidence of AOM on exam. No meningeal signs. No history components or rashes to raise concern for tic borne illness. EKG with no STEMI, do not suspect ACS. Low risk wells doubt PE. CXR w/o pneumothorax. Chest pain reproducible w/ chest wall palpation, suspect MSK/pleurisy related to coughing. Suspect allergic vs viral etiology, considering covid 19 given patient has known exposure w/ her constellation of sxs, [facility] [employer] test sent. Supportive tx w/ flonase, tessalon, & tylenol. Quarantine instructions. I discussed results, treatment plan, need for follow-up, and return precautions with the patient. Provided opportunity for questions, patient confirmed understanding and is in agreement with plan.   Stanton KidneyCarmen Cruz Magda BernheimHernandez Lopez was evaluated in Emergency Department on 10/21/2018 for the symptoms described in the history of present illness. He/she was evaluated in the context of the global COVID-19 pandemic, which necessitated consideration that the patient might be at risk for infection with the SARS-CoV-2 virus that causes COVID-19. Institutional protocols and algorithms that pertain to the evaluation of patients at risk for COVID-19 are in a state of rapid change based on information released by regulatory bodies including the CDC and federal and state organizations. These policies and algorithms  were followed during the patient's care in the ED.  Final Clinical Impressions(s) / ED Diagnoses   Final diagnoses:  Generalized body aches    ED Discharge Orders    None  Desmond Lopeetrucelli, Aeden Matranga R, PA-C 10/21/18 1400    Loren RacerYelverton, David, MD 10/22/18 2045

## 2018-10-23 LAB — NOVEL CORONAVIRUS, NAA (HOSP ORDER, SEND-OUT TO REF LAB; TAT 18-24 HRS): SARS-CoV-2, NAA: DETECTED — AB

## 2019-02-06 ENCOUNTER — Encounter (HOSPITAL_COMMUNITY): Payer: Self-pay

## 2019-02-06 ENCOUNTER — Other Ambulatory Visit: Payer: Self-pay

## 2019-02-06 ENCOUNTER — Ambulatory Visit (HOSPITAL_COMMUNITY)
Admission: RE | Admit: 2019-02-06 | Discharge: 2019-02-06 | Disposition: A | Discharge status: Home / Self Care | Payer: No Typology Code available for payment source | Source: Ambulatory Visit | Attending: Obstetrics and Gynecology | Admitting: Obstetrics and Gynecology

## 2019-02-06 DIAGNOSIS — Z01419 Encounter for gynecological examination (general) (routine) without abnormal findings: Secondary | ICD-10-CM | POA: Insufficient documentation

## 2019-02-06 NOTE — Progress Notes (Signed)
No complaints today.   Pap Smear: Pap smear completed today. Last Pap smear was in August 2019 at the [facility] and normal per patient. 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 Last Pap smear result is not in Epic. Previous Pap smear and colposcopy result above is in Epic.  Pelvic/Bimanual   Ext Genitalia No lesions, no swelling and no discharge observed on external genitalia.         Vagina Vagina pink and normal texture. No lesions or discharge observed in vagina.          Cervix Cervix is present. Cervix pink and of normal texture. No discharge observed.     Uterus Uterus is present and palpable. Uterus in normal position and normal size.       Adnexae Bilateral ovaries present and palpable. No tenderness on palpation.         Rectovaginal No rectal exam completed today since patient had no rectal complaints. No skin abnormalities observed on exam.    Smoking History: Patient has never smoked.  Patient Navigation: Patient education provided. Access to services provided for patient through [facility] program. Spanish interpreter provided.   Breast and Cervical Cancer Risk Assessment: Patient has no family history of breast cancer, known genetic mutations, or radiation treatment to the chest before age 77. Patient has a history of cervical dysplasia. Patient has no history of being immunocompromised and DES exposure in-utero. Breast cancer risk assessment completed. No breast cancer risk calculated due to patient is less than 35 years old.  Used Spanish interpreter [employer] from [employer].

## 2019-02-06 NOTE — Patient Instructions (Signed)
Let Shelia Ford Shelia Ford know that her next Pap smear is due in one year due to her history of an abnormal Pap smear. Let patient know will follow up with her within the next couple weeks with results of Pap smear by letter or phone. [place] verbalized understanding.  [name], Arvil Chaco, RN 2:12 PM

## 2019-02-09 LAB — CYTOLOGY - PAP
Adequacy: ABSENT
Diagnosis: NEGATIVE
High risk HPV: NEGATIVE

## 2019-02-21 ENCOUNTER — Telehealth (HOSPITAL_COMMUNITY): Payer: Self-pay | Admitting: *Deleted

## 2019-02-21 NOTE — Telephone Encounter (Signed)
Called patient with Spanish interpreter Benjamine Sprague and let her know that her Pap smear was normal and HPV negative. Informed patient that her next Pap smear is due in one year due to her history of an abnormal Pap smear in 2018. Patient verbalized understanding.

## 2020-03-20 ENCOUNTER — Ambulatory Visit: Payer: No Typology Code available for payment source

## 2020-04-22 ENCOUNTER — Ambulatory Visit: Payer: No Typology Code available for payment source

## 2022-01-07 ENCOUNTER — Encounter (HOSPITAL_COMMUNITY): Payer: Self-pay

## 2022-01-07 ENCOUNTER — Inpatient Hospital Stay (HOSPITAL_COMMUNITY)
Admission: EM | Admit: 2022-01-07 | Discharge: 2022-01-12 | DRG: 440 | Disposition: A | Discharge status: Home / Self Care | Payer: No Typology Code available for payment source | Attending: Internal Medicine | Admitting: Internal Medicine

## 2022-01-07 ENCOUNTER — Other Ambulatory Visit: Payer: Self-pay

## 2022-01-07 ENCOUNTER — Emergency Department (HOSPITAL_COMMUNITY): Payer: No Typology Code available for payment source

## 2022-01-07 DIAGNOSIS — K2980 Duodenitis without bleeding: Secondary | ICD-10-CM | POA: Diagnosis present

## 2022-01-07 DIAGNOSIS — Z79899 Other long term (current) drug therapy: Secondary | ICD-10-CM

## 2022-01-07 DIAGNOSIS — R739 Hyperglycemia, unspecified: Secondary | ICD-10-CM

## 2022-01-07 DIAGNOSIS — E1165 Type 2 diabetes mellitus with hyperglycemia: Secondary | ICD-10-CM | POA: Diagnosis present

## 2022-01-07 DIAGNOSIS — K858 Other acute pancreatitis without necrosis or infection: Principal | ICD-10-CM | POA: Diagnosis present

## 2022-01-07 DIAGNOSIS — E119 Type 2 diabetes mellitus without complications: Secondary | ICD-10-CM

## 2022-01-07 DIAGNOSIS — K859 Acute pancreatitis without necrosis or infection, unspecified: Principal | ICD-10-CM

## 2022-01-07 DIAGNOSIS — Z794 Long term (current) use of insulin: Secondary | ICD-10-CM

## 2022-01-07 DIAGNOSIS — E876 Hypokalemia: Secondary | ICD-10-CM | POA: Diagnosis present

## 2022-01-07 DIAGNOSIS — E781 Pure hyperglyceridemia: Secondary | ICD-10-CM

## 2022-01-07 DIAGNOSIS — Z7984 Long term (current) use of oral hypoglycemic drugs: Secondary | ICD-10-CM

## 2022-01-07 DIAGNOSIS — Z833 Family history of diabetes mellitus: Secondary | ICD-10-CM

## 2022-01-07 DIAGNOSIS — E785 Hyperlipidemia, unspecified: Secondary | ICD-10-CM | POA: Diagnosis present

## 2022-01-07 HISTORY — DX: Pure hyperglyceridemia: E78.1

## 2022-01-07 LAB — I-STAT BETA HCG BLOOD, ED (MC, WL, AP ONLY): I-stat hCG, quantitative: <5 m[IU]/mL (ref <5)

## 2022-01-07 LAB — COMPREHENSIVE METABOLIC PANEL
Albumin: 4 g/dL (ref 3.5–5.0)
Anion gap: 17 — ABNORMAL HIGH (ref 5–15)
BUN: 8 mg/dL (ref 6–20)
CO2: 14 mmol/L — ABNORMAL LOW (ref 22–32)
Calcium: 8.8 mg/dL — ABNORMAL LOW (ref 8.9–10.3)
Chloride: 105 mmol/L (ref 98–111)
Creatinine, Ser: 0.51 mg/dL (ref 0.44–1.00)
GFR, Estimated: >60 mL/min (ref >60)
Glucose, Bld: 286 mg/dL — ABNORMAL HIGH (ref 70–99)
Sodium: 136 mmol/L (ref 135–145)

## 2022-01-07 LAB — URINALYSIS, ROUTINE W REFLEX MICROSCOPIC
Bacteria, UA: NONE SEEN
Bilirubin Urine: NEGATIVE
Glucose, UA: >=500 mg/dL — AB
Hgb urine dipstick: NEGATIVE
Ketones, ur: 80 mg/dL — AB
Leukocytes,Ua: NEGATIVE
Nitrite: NEGATIVE
Protein, ur: 100 mg/dL — AB
Specific Gravity, Urine: 1.039 — ABNORMAL HIGH (ref 1.005–1.030)
pH: 5 (ref 5.0–8.0)

## 2022-01-07 LAB — I-STAT CHEM 8, ED
BUN: 5 mg/dL — ABNORMAL LOW (ref 6–20)
Calcium, Ion: 1.09 mmol/L — ABNORMAL LOW (ref 1.15–1.40)
Chloride: 108 mmol/L (ref 98–111)
Creatinine, Ser: 0.2 mg/dL — ABNORMAL LOW (ref 0.44–1.00)
Glucose, Bld: 274 mg/dL — ABNORMAL HIGH (ref 70–99)
HCT: 44 % (ref 36.0–46.0)
Hemoglobin: 15 g/dL (ref 12.0–15.0)
Potassium: 5 mmol/L (ref 3.5–5.1)
Sodium: 135 mmol/L (ref 135–145)
TCO2: 19 mmol/L — ABNORMAL LOW (ref 22–32)

## 2022-01-07 LAB — I-STAT VENOUS BLOOD GAS, ED
Acid-base deficit: 7 mmol/L — ABNORMAL HIGH (ref 0.0–2.0)
Bicarbonate: 17.7 mmol/L — ABNORMAL LOW (ref 20.0–28.0)
Calcium, Ion: 1.17 mmol/L (ref 1.15–1.40)
HCT: 44 % (ref 36.0–46.0)
Hemoglobin: 15 g/dL (ref 12.0–15.0)
O2 Saturation: 86 %
Potassium: 4.3 mmol/L (ref 3.5–5.1)
Sodium: 135 mmol/L (ref 135–145)
TCO2: 19 mmol/L — ABNORMAL LOW (ref 22–32)
pCO2, Ven: 33.1 mmHg — ABNORMAL LOW (ref 44–60)
pH, Ven: 7.336 (ref 7.25–7.43)
pO2, Ven: 55 mmHg — ABNORMAL HIGH (ref 32–45)

## 2022-01-07 LAB — CBC
HCT: 44 % (ref 36.0–46.0)
Hemoglobin: 15.2 g/dL — ABNORMAL HIGH (ref 12.0–15.0)
MCH: 29.3 pg (ref 26.0–34.0)
MCHC: 34.5 g/dL (ref 30.0–36.0)
MCV: 84.8 fL (ref 80.0–100.0)
Platelets: 289 10*3/uL (ref 150–400)
RBC: 5.19 MIL/uL — ABNORMAL HIGH (ref 3.87–5.11)
RDW: 12.9 % (ref 11.5–15.5)
WBC: 12.8 10*3/uL — ABNORMAL HIGH (ref 4.0–10.5)
nRBC: 0.2 % (ref 0.0–0.2)

## 2022-01-07 LAB — CBG MONITORING, ED
Glucose-Capillary: 244 mg/dL — ABNORMAL HIGH (ref 70–99)
Glucose-Capillary: 303 mg/dL — ABNORMAL HIGH (ref 70–99)

## 2022-01-07 MED ORDER — SODIUM CHLORIDE 0.9 % IV BOLUS
1000.0000 mL | Freq: Once | INTRAVENOUS | Status: AC
  Administered 2022-01-07: 1000 mL via INTRAVENOUS

## 2022-01-07 MED ORDER — DEXTROSE IN LACTATED RINGERS 5 % IV SOLN: INTRAVENOUS | Status: DC

## 2022-01-07 MED ORDER — ONDANSETRON HCL 4 MG/2ML IJ SOLN
4.0000 mg | Freq: Once | INTRAMUSCULAR | Status: AC
Start: 2022-01-07 — End: 2022-01-07
  Administered 2022-01-07: 4 mg via INTRAVENOUS
  Filled 2022-01-07: qty 2

## 2022-01-07 MED ORDER — OXYCODONE HCL 5 MG PO TABS
5.0000 mg | ORAL_TABLET | Freq: Once | ORAL | Status: AC
  Administered 2022-01-07: 5 mg via ORAL
  Filled 2022-01-07: qty 1

## 2022-01-07 MED ORDER — SODIUM CHLORIDE 0.9 % IV BOLUS: 1000.0000 mL | Freq: Once | INTRAVENOUS | Status: DC

## 2022-01-07 MED ORDER — IOHEXOL 300 MG/ML  SOLN
100.0000 mL | Freq: Once | INTRAMUSCULAR | Status: AC | PRN
  Administered 2022-01-07: 100 mL via INTRAVENOUS

## 2022-01-07 MED ORDER — INSULIN REGULAR(HUMAN) IN NACL 100-0.9 UT/100ML-% IV SOLN
INTRAVENOUS | Status: DC
  Administered 2022-01-07: 7 [IU]/h via INTRAVENOUS
  Filled 2022-01-07: qty 100

## 2022-01-07 MED ORDER — MORPHINE SULFATE (PF) 4 MG/ML IV SOLN
4.0000 mg | Freq: Once | INTRAVENOUS | Status: AC
  Administered 2022-01-07: 4 mg via INTRAVENOUS
  Filled 2022-01-07: qty 1

## 2022-01-07 MED ORDER — LACTATED RINGERS IV SOLN: INTRAVENOUS | Status: DC

## 2022-01-07 MED ORDER — PANTOPRAZOLE SODIUM 40 MG IV SOLR
40.0000 mg | Freq: Two times a day (BID) | INTRAVENOUS | Status: DC
  Administered 2022-01-08 – 2022-01-10 (×6): 40 mg via INTRAVENOUS
  Filled 2022-01-07 (×5): qty 10

## 2022-01-07 MED ORDER — DEXTROSE 50 % IV SOLN: 0.0000 mL | INTRAVENOUS | Status: DC | PRN

## 2022-01-07 NOTE — ED Notes (Signed)
Pt to CT at this time.

## 2022-01-07 NOTE — ED Provider Triage Note (Signed)
Emergency Medicine Provider Triage Evaluation Note  Shelia Ford , a 31 y.o. female  was evaluated in triage.  Pt complains of abdominal pain starting this morning. Localizes it to upper abdomen with nausea and vomiting. Hx of c-section, no other abdominal surgeries.   Review of Systems  Positive: Abd pain, nausea, vomiting Negative: Diarrhea, urinary sx, vaginal discharge  Physical Exam  BP (!) 150/100 (BP Location: Right Arm)   Pulse 98   Temp 98.7 F (37.1 C) (Oral)   Resp 16   Ht 5\' 3"  (1.6 m)   Wt 73.5 kg   SpO2 99%   BMI 28.70 kg/m  Gen:   Awake, no distress   Resp:  Normal effort  MSK:   Moves extremities without difficulty  Other:    Medical Decision Making  Medically screening exam initiated at 2:52 PM.  Appropriate orders placed.  Shelia Ford was informed that the remainder of the evaluation will be completed by another provider, this initial triage assessment does not replace that evaluation, and the importance of remaining in the ED until their evaluation is complete.     Magda Bernheim, PA-C 01/07/22 1453

## 2022-01-07 NOTE — ED Notes (Signed)
Lab called, advised that pt's hx of hyperlipidemia caused hemolysis of labs. EDP advised, istat chem8 placed to assess renal function for CT

## 2022-01-07 NOTE — ED Provider Notes (Incomplete)
[facility] EMERGENCY DEPARTMENT Provider Note   CSN: [id number] Arrival date & time: 01/07/22  1426     History {Add pertinent medical, surgical, social history, OB history to HPI:1} Chief Complaint  Patient presents with  . Abdominal Pain    Shelia Ford is a 31 y.o. female.   Abdominal Pain   31 year old female presents emergency department with complaints of abdominal pain.  She states that pain is located in the epigastric region with some radiation to her back.  She states pain began insidiously waking up this morning.  She tried to eat food.  It resulted in increased pain as well as nausea and vomiting.  She states pain has been persistent since symptom onset.  Denies fever, chills, night sweats, chest pain, shortness of breath, hematemesis, urinary/vaginal symptoms, change in bowel habits.  Past abdominal surgeries include cesarean section x3.  Denies alcohol use within the past few days.  Past medical history significant for diabetes mellitus, hyperlipidemia  Home Medications Prior to Admission medications   Medication Sig Start Date End Date Taking? Authorizing Provider  acetaminophen (TYLENOL 8 HOUR) 650 MG CR tablet Take 1 tablet (650 mg total) by mouth every 8 (eight) hours as needed for pain. 10/21/18   Petrucelli, Samantha R, PA-C  benzonatate (TESSALON) 100 MG capsule Take 1 capsule (100 mg total) by mouth every 8 (eight) hours. 10/21/18   Petrucelli, Samantha R, PA-C  fluticasone (FLONASE) 50 MCG/ACT nasal spray Place 1 spray into both nostrils daily. 10/21/18   Petrucelli, Samantha R, PA-C  metFORMIN (GLUCOPHAGE) 500 MG tablet Take by mouth 2 (two) times daily with a meal.    [provider]  oxyCODONE-acetaminophen (PERCOCET/ROXICET) 5-325 MG per tablet Take 1 tablet by mouth every 4 (four) hours as needed (for pain scale less than 7). Patient not taking: Reported on 11/25/2016 03/24/14   Fredirick Lathe, MD      Allergies     Patient has no known allergies.    Review of Systems   Review of Systems  Gastrointestinal:  Positive for abdominal pain.  All other systems reviewed and are negative.   Physical Exam Updated Vital Signs BP (!) 150/100 (BP Location: Right Arm)   Pulse 98   Temp 98.7 F (37.1 C) (Oral)   Resp 16   Ht 5\' 3"  (1.6 m)   Wt 73.5 kg   SpO2 99%   BMI 28.70 kg/m  Physical Exam Vitals and nursing note reviewed.  Constitutional:      General: She is not in acute distress.    Appearance: She is well-developed.  HENT:     Head: Normocephalic and atraumatic.  Eyes:     Conjunctiva/sclera: Conjunctivae normal.  Cardiovascular:     Rate and Rhythm: Normal rate and regular rhythm.     Heart sounds: No murmur heard. Pulmonary:     Effort: Pulmonary effort is normal. No respiratory distress.     Breath sounds: Normal breath sounds.  Abdominal:     Palpations: Abdomen is soft.     Tenderness: There is abdominal tenderness in the right upper quadrant and epigastric area. There is no right CVA tenderness or left CVA tenderness.  Musculoskeletal:        General: No swelling.     Cervical back: Neck supple.  Skin:    General: Skin is warm and dry.     Capillary Refill: Capillary refill takes less than 2 seconds.  Neurological:     Mental Status: She  is alert.  Psychiatric:        Mood and Affect: Mood normal.     ED Results / Procedures / Treatments   Labs (all labs ordered are listed, but only abnormal results are displayed) Labs Reviewed  CBC - Abnormal; Notable for the following components:      Result Value   WBC 12.8 (*)    RBC 5.19 (*)    Hemoglobin 15.2 (*)    All other components within normal limits  URINALYSIS, ROUTINE W REFLEX MICROSCOPIC - Abnormal; Notable for the following components:   Specific Gravity, Urine 1.039 (*)    Glucose, UA >=500 (*)    Ketones, ur 80 (*)    Protein, ur 100 (*)    All other components within normal limits  CBG MONITORING, ED -  Abnormal; Notable for the following components:   Glucose-Capillary 244 (*)    All other components within normal limits  COMPREHENSIVE METABOLIC PANEL  LIPASE, BLOOD  I-STAT BETA HCG BLOOD, ED ([facility], [facility], [facility] ONLY)    EKG None  Radiology No results found.  Procedures Procedures  {Document cardiac monitor, telemetry assessment procedure when appropriate:1}  Medications Ordered in ED Medications  sodium chloride 0.9 % bolus 1,000 mL (has no administration in time range)  morphine (PF) 4 MG/ML injection 4 mg (has no administration in time range)  ondansetron (ZOFRAN) injection 4 mg (has no administration in time range)  oxyCODONE (Oxy IR/ROXICODONE) immediate release tablet 5 mg (5 mg Oral Given 01/07/22 1610)    ED Course/ Medical Decision Making/ A&P Clinical Course as of 01/07/22 2355  Thu Jan 07, 2022  2049 CMP as well as lipase was sent multiple times to the lab.  Lab reported patient had such hyperlipidemia that RBCs hemolyzed before they were able to be run.  They suggested i-STAT Chem-8 for assessment of renal function [CR]    Clinical Course User Index [CR] Peter Garter, PA                           Medical Decision Making Amount and/or Complexity of Data Reviewed Labs: ordered. Radiology: ordered.  Risk Prescription drug management. Decision regarding hospitalization.   This patient presents to the ED for concern of abdominal pain, this involves an extensive number of treatment options, and is a complaint that carries with it a high risk of complications and morbidity.  The differential diagnosis includes The causes of generalized abdominal pain include but are not limited to AAA, mesenteric ischemia, appendicitis, diverticulitis, DKA, gastritis, gastroenteritis, AMI, nephrolithiasis, pancreatitis, peritonitis, constipation, UTI,SBO/LBO, biliary disease, IBD, IBS, PUD, or hepatitis. Ectopic pregnancy, ovarian torsion, PID.    Co morbidities that complicate the  patient evaluation  See HPI   Additional history obtained:  Additional history obtained from EMR  Lab Tests:  I Ordered, and personally interpreted labs.  The pertinent results include: Mild leukocytosis of 12.8 with hemoglobin of 15.2.  CMP indicative of decreased bicarb of 14 as well as elevated ion gap of 17 with blood glucose of 303 indicative of hyperglycemia.    Imaging Studies ordered:  I ordered imaging studies including CT abdomen pelvis I independently visualized and interpreted imaging which showed marked thickening of the second and third part of the duodenum with fat stranding edema about the duodenal in head of pancreas indicative of severe duodenitis or pancreatitis.  No active luminal free air to suggest duodenal ulceration/perforation.  Bowel loops are normal in caliber.  Normal appendix.  No evidence of nephrolithiasis or hydronephrosis.  Uterus and adnexa are unremarkable. I agree with the radiologist interpretation  Cardiac Monitoring: / EKG:  The patient was maintained on a cardiac monitor.  I personally viewed and interpreted the cardiac monitored which showed an underlying rhythm of: Sinus rhythm,   Consultations Obtained:  See ED course  Problem List / ED Course / Critical interventions / Medication management  Pancreatitis/hyperglycemia I ordered medication including ***  for ***  Reevaluation of the patient after these medicines showed that the patient {resolved/improved/worsened:23923::"improved"} I have reviewed the patients home medicines and have made adjustments as needed   Social Determinants of Health:  ***   Test / Admission - Considered:  Vitals signs significant for ***. Otherwise within normal range and stable throughout visit. Laboratory/imaging studies significant for: *** *** Treatment plan were discussed at length with patient and they knowledge understanding was agreeable to said plan.  Appropriate consultations were made as  described in the ED course.  Patient was stable upon admission to the hospital.   {Document critical care time when appropriate:1} {Document review of labs and clinical decision tools ie heart score, Chads2Vasc2 etc:1}  {Document your independent review of radiology images, and any outside records:1} {Document your discussion with family members, caretakers, and with consultants:1} {Document social determinants of health affecting pt's care:1} {Document your decision making why or why not admission, treatments were needed:1} Final Clinical Impression(s) / ED Diagnoses Final diagnoses:  None    Rx / DC Orders ED Discharge Orders     None

## 2022-01-07 NOTE — ED Provider Notes (Signed)
[facility] EMERGENCY DEPARTMENT Provider Note   CSN: [id number] Arrival date & time: 01/07/22  1426     History  Chief Complaint  Patient presents with   Abdominal Pain    Shelia Ford is a 31 y.o. female.   Abdominal Pain   31 year old female presents emergency department with complaints of abdominal pain.  She states that pain is located in the epigastric region with some radiation to her back.  She states pain began insidiously waking up this morning.  She tried to eat food.  It resulted in increased pain as well as nausea and vomiting.  She states pain has been persistent since symptom onset.  Denies fever, chills, night sweats, chest pain, shortness of breath, hematemesis, urinary/vaginal symptoms, change in bowel habits.  Past abdominal surgeries include cesarean section x3.  Denies alcohol use within the past few days.  Past medical history significant for diabetes mellitus, hyperlipidemia  Home Medications Prior to Admission medications   Medication Sig Start Date End Date Taking? Authorizing Provider  acetaminophen (TYLENOL 8 HOUR) 650 MG CR tablet Take 1 tablet (650 mg total) by mouth every 8 (eight) hours as needed for pain. 10/21/18  Yes Petrucelli, Samantha R, PA-C  blood glucose meter kit and supplies Dispense based on patient and insurance preference. Use up to four times daily as directed. (FOR ICD-10 E10.9, E11.9). 01/12/22  Yes Ghimire, Henreitta Leber, MD  insulin aspart protamine - aspart (NOVOLOG 70/30 MIX) (70-30) 100 UNIT/ML FlexPen Inject 20 Units into the skin 2 (two) times daily with a meal. 01/12/22  Yes Ghimire, Henreitta Leber, MD  insulin isophane & regular human KwikPen (NOVOLIN 70/30 KWIKPEN) (70-30) 100 UNIT/ML KwikPen Inject 20 Units into the skin 2 (two) times daily. These are your refills-please start taking this medication only after you have finished the insulin that you got from the hospital.DO NOT TAKE THIS INSULIN UNLESS [place] 02/11/22  Yes Ghimire, Henreitta Leber, MD  Insulin Pen Needle 32G X 4 MM MISC Use as directed with insulin. 01/12/22  Yes Ghimire, Henreitta Leber, MD  Insulin Pen Needle 32G X 4 MM MISC 1 each by Does not apply route as needed. 01/12/22  Yes Ghimire, Henreitta Leber, MD  Multiple Vitamin (MULTIVITAMIN WITH MINERALS) TABS tablet Take 1 tablet by mouth daily.   Yes [provider]  ezetimibe (ZETIA) 10 MG tablet Take 1 tablet (10 mg total) by mouth daily. 01/13/22   Ghimire, Henreitta Leber, MD  fenofibrate 160 MG tablet Take 1 tablet (160 mg total) by mouth daily. 01/13/22   Ghimire, Henreitta Leber, MD  Fingerstix Lancets MISC Use as directed to check blood sugars up to 4 times daily 01/12/22   Ghimire, Henreitta Leber, MD  glucose blood test strip Use as directed to check blood sugars up to 4 times daily 01/12/22   Ghimire, Henreitta Leber, MD  metFORMIN (GLUCOPHAGE) 500 MG tablet Take 1 tablet (500 mg total) by mouth 2 (two) times daily with a meal. 01/12/22   Ghimire, Henreitta Leber, MD      Allergies    Patient has no known allergies.    Review of Systems   Review of Systems  Gastrointestinal:  Positive for abdominal pain.  All other systems reviewed and are negative.   Physical Exam Updated Vital Signs BP 112/84 (BP Location: Right Arm)   Pulse 85   Temp 98.5 F (36.9 C) (Oral)   Resp 16  Ht '5\' 3"'  (1.6 m)   Wt 73.5 kg   SpO2 98%   BMI 28.70 kg/m  Physical Exam Vitals and nursing note reviewed.  Constitutional:      General: She is not in acute distress.    Appearance: She is well-developed.  HENT:     Head: Normocephalic and atraumatic.  Eyes:     Conjunctiva/sclera: Conjunctivae normal.  Cardiovascular:     Rate and Rhythm: Normal rate and regular rhythm.     Heart sounds: No murmur heard. Pulmonary:     Effort: Pulmonary effort is normal. No respiratory distress.     Breath sounds: Normal breath sounds.  Abdominal:     Palpations: Abdomen is soft.      Tenderness: There is abdominal tenderness in the right upper quadrant and epigastric area. There is no right CVA tenderness or left CVA tenderness.  Musculoskeletal:        General: No swelling.     Cervical back: Neck supple.  Skin:    General: Skin is warm and dry.     Capillary Refill: Capillary refill takes less than 2 seconds.  Neurological:     Mental Status: She is alert.  Psychiatric:        Mood and Affect: Mood normal.     ED Results / Procedures / Treatments   Labs (all labs ordered are listed, but only abnormal results are displayed) Labs Reviewed  CBC - Abnormal; Notable for the following components:      Result Value   WBC 12.8 (*)    RBC 5.19 (*)    Hemoglobin 15.2 (*)    All other components within normal limits  URINALYSIS, ROUTINE W REFLEX MICROSCOPIC - Abnormal; Notable for the following components:   Specific Gravity, Urine 1.039 (*)    Glucose, UA >=500 (*)    Ketones, ur 80 (*)    Protein, ur 100 (*)    All other components within normal limits  COMPREHENSIVE METABOLIC PANEL - Abnormal; Notable for the following components:   CO2 14 (*)    Glucose, Bld 286 (*)    Calcium 8.8 (*)    Anion gap 17 (*)    All other components within normal limits  BETA-HYDROXYBUTYRIC ACID - Abnormal; Notable for the following components:   Beta-Hydroxybutyric Acid 4.30 (*)    All other components within normal limits  LIPID PANEL - Abnormal; Notable for the following components:   Cholesterol 472 (*)    Triglycerides 3,480 (*)    All other components within normal limits  COMPREHENSIVE METABOLIC PANEL - Abnormal; Notable for the following components:   Sodium 133 (*)    CO2 18 (*)    Glucose, Bld 144 (*)    Creatinine, Ser 0.40 (*)    Calcium 8.5 (*)    Total Protein 5.5 (*)    Albumin 2.6 (*)    All other components within normal limits  GLUCOSE, CAPILLARY - Abnormal; Notable for the following components:   Glucose-Capillary 175 (*)    All other components  within normal limits  GLUCOSE, CAPILLARY - Abnormal; Notable for the following components:   Glucose-Capillary 158 (*)    All other components within normal limits  COMPREHENSIVE METABOLIC PANEL - Abnormal; Notable for the following components:   Potassium 3.1 (*)    CO2 20 (*)    BUN <5 (*)    Creatinine, Ser 0.34 (*)    Calcium 8.1 (*)    Total Protein 5.2 (*)  Albumin 2.3 (*)    AST 13 (*)    Anion gap 4 (*)    All other components within normal limits  CBC - Abnormal; Notable for the following components:   Hemoglobin 11.8 (*)    HCT 33.9 (*)    All other components within normal limits  MAGNESIUM - Abnormal; Notable for the following components:   Magnesium 1.6 (*)    All other components within normal limits  PHOSPHORUS - Abnormal; Notable for the following components:   Phosphorus 2.0 (*)    All other components within normal limits  GLUCOSE, CAPILLARY - Abnormal; Notable for the following components:   Glucose-Capillary 162 (*)    All other components within normal limits  GLUCOSE, CAPILLARY - Abnormal; Notable for the following components:   Glucose-Capillary 112 (*)    All other components within normal limits  CBC - Abnormal; Notable for the following components:   WBC 10.8 (*)    All other components within normal limits  BASIC METABOLIC PANEL - Abnormal; Notable for the following components:   Potassium 3.1 (*)    CO2 19 (*)    Glucose, Bld 110 (*)    Calcium 8.3 (*)    All other components within normal limits  GLUCOSE, CAPILLARY - Abnormal; Notable for the following components:   Glucose-Capillary 107 (*)    All other components within normal limits  GLUCOSE, CAPILLARY - Abnormal; Notable for the following components:   Glucose-Capillary 106 (*)    All other components within normal limits  GLUCOSE, CAPILLARY - Abnormal; Notable for the following components:   Glucose-Capillary 100 (*)    All other components within normal limits  GLUCOSE, CAPILLARY -  Abnormal; Notable for the following components:   Glucose-Capillary 102 (*)    All other components within normal limits  TRIGLYCERIDES - Abnormal; Notable for the following components:   Triglycerides 263 (*)    All other components within normal limits  LIPASE, BLOOD - Abnormal; Notable for the following components:   Lipase 82 (*)    All other components within normal limits  GLUCOSE, CAPILLARY - Abnormal; Notable for the following components:   Glucose-Capillary 171 (*)    All other components within normal limits  GLUCOSE, CAPILLARY - Abnormal; Notable for the following components:   Glucose-Capillary 225 (*)    All other components within normal limits  CBC - Abnormal; Notable for the following components:   RBC 3.65 (*)    Hemoglobin 10.8 (*)    HCT 31.2 (*)    All other components within normal limits  COMPREHENSIVE METABOLIC PANEL - Abnormal; Notable for the following components:   Potassium 3.0 (*)    Glucose, Bld 196 (*)    BUN <5 (*)    Creatinine, Ser 0.37 (*)    Calcium 8.1 (*)    Total Protein 5.3 (*)    Albumin 2.2 (*)    AST 10 (*)    All other components within normal limits  MAGNESIUM - Abnormal; Notable for the following components:   Magnesium 1.6 (*)    All other components within normal limits  PHOSPHORUS - Abnormal; Notable for the following components:   Phosphorus 2.2 (*)    All other components within normal limits  HEMOGLOBIN A1C - Abnormal; Notable for the following components:   Hgb A1c MFr Bld 11.4 (*)    All other components within normal limits  GLUCOSE, CAPILLARY - Abnormal; Notable for the following components:   Glucose-Capillary 199 (*)  All other components within normal limits  GLUCOSE, CAPILLARY - Abnormal; Notable for the following components:   Glucose-Capillary 185 (*)    All other components within normal limits  TRIGLYCERIDES - Abnormal; Notable for the following components:   Triglycerides 270 (*)    All other components  within normal limits  GLUCOSE, CAPILLARY - Abnormal; Notable for the following components:   Glucose-Capillary 170 (*)    All other components within normal limits  GLUCOSE, CAPILLARY - Abnormal; Notable for the following components:   Glucose-Capillary 252 (*)    All other components within normal limits  GLUCOSE, CAPILLARY - Abnormal; Notable for the following components:   Glucose-Capillary 152 (*)    All other components within normal limits  BASIC METABOLIC PANEL - Abnormal; Notable for the following components:   Potassium 3.1 (*)    Glucose, Bld 224 (*)    BUN <5 (*)    Creatinine, Ser 0.34 (*)    Calcium 8.6 (*)    All other components within normal limits  GLUCOSE, CAPILLARY - Abnormal; Notable for the following components:   Glucose-Capillary 187 (*)    All other components within normal limits  GLUCOSE, CAPILLARY - Abnormal; Notable for the following components:   Glucose-Capillary 189 (*)    All other components within normal limits  GLUCOSE, CAPILLARY - Abnormal; Notable for the following components:   Glucose-Capillary 195 (*)    All other components within normal limits  GLUCOSE, CAPILLARY - Abnormal; Notable for the following components:   Glucose-Capillary 205 (*)    All other components within normal limits  BASIC METABOLIC PANEL - Abnormal; Notable for the following components:   Glucose, Bld 243 (*)    All other components within normal limits  GLUCOSE, CAPILLARY - Abnormal; Notable for the following components:   Glucose-Capillary 226 (*)    All other components within normal limits  GLUCOSE, CAPILLARY - Abnormal; Notable for the following components:   Glucose-Capillary 224 (*)    All other components within normal limits  GLUCOSE, CAPILLARY - Abnormal; Notable for the following components:   Glucose-Capillary 337 (*)    All other components within normal limits  CBG MONITORING, ED - Abnormal; Notable for the following components:   Glucose-Capillary  244 (*)    All other components within normal limits  I-STAT CHEM 8, ED - Abnormal; Notable for the following components:   BUN 5 (*)    Creatinine, Ser 0.20 (*)    Glucose, Bld 274 (*)    Calcium, Ion 1.09 (*)    TCO2 19 (*)    All other components within normal limits  I-STAT VENOUS BLOOD GAS, ED - Abnormal; Notable for the following components:   pCO2, Ven 33.1 (*)    pO2, Ven 55 (*)    Bicarbonate 17.7 (*)    TCO2 19 (*)    Acid-base deficit 7.0 (*)    All other components within normal limits  CBG MONITORING, ED - Abnormal; Notable for the following components:   Glucose-Capillary 303 (*)    All other components within normal limits  CBG MONITORING, ED - Abnormal; Notable for the following components:   Glucose-Capillary 292 (*)    All other components within normal limits  CBG MONITORING, ED - Abnormal; Notable for the following components:   Glucose-Capillary 291 (*)    All other components within normal limits  CBG MONITORING, ED - Abnormal; Notable for the following components:   Glucose-Capillary 254 (*)    All other  components within normal limits  CBG MONITORING, ED - Abnormal; Notable for the following components:   Glucose-Capillary 242 (*)    All other components within normal limits  CBG MONITORING, ED - Abnormal; Notable for the following components:   Glucose-Capillary 209 (*)    All other components within normal limits  CBG MONITORING, ED - Abnormal; Notable for the following components:   Glucose-Capillary 188 (*)    All other components within normal limits  LDL CHOLESTEROL, DIRECT  GLUCOSE, CAPILLARY  LACTIC ACID, PLASMA  GLUCOSE, CAPILLARY  MAGNESIUM  GLUCOSE, CAPILLARY  GLUCOSE, CAPILLARY  GLUCOSE, CAPILLARY  GLUCOSE, CAPILLARY  GLUCOSE, CAPILLARY  GLUCOSE, CAPILLARY  GLUCOSE, CAPILLARY  MAGNESIUM  PHOSPHORUS  MAGNESIUM  I-STAT BETA HCG BLOOD, ED ([facility], [facility], [facility] ONLY)    EKG None  Radiology No results found.  Procedures Procedures     Medications Ordered in ED Medications  oxyCODONE (Oxy IR/ROXICODONE) immediate release tablet 5 mg (5 mg Oral Given 01/07/22 1610)  sodium chloride 0.9 % bolus 1,000 mL (0 mLs Intravenous Stopped 01/07/22 1810)  morphine (PF) 4 MG/ML injection 4 mg (4 mg Intravenous Given 01/07/22 1705)  ondansetron (ZOFRAN) injection 4 mg (4 mg Intravenous Given 01/07/22 1706)  iohexol (OMNIPAQUE) 300 MG/ML solution 100 mL (100 mLs Intravenous Contrast Given 01/07/22 2130)  morphine (PF) 4 MG/ML injection 4 mg (4 mg Intravenous Given 01/07/22 2327)  sodium chloride 0.9 % bolus 1,000 mL (0 mLs Intravenous Stopped 01/08/22 2100)  magnesium sulfate IVPB 1 g 100 mL (1 g Intravenous New Bag/Given 01/08/22 2323)  potassium chloride 10 mEq in 100 mL IVPB (10 mEq Intravenous New Bag/Given 01/09/22 0435)  potassium PHOSPHATE 30 mmol in dextrose 5 % 500 mL infusion (30 mmol Intravenous New Bag/Given 01/09/22 0943)  magnesium sulfate IVPB 4 g 100 mL (4 g Intravenous New Bag/Given 01/09/22 1007)  potassium chloride SA (KLOR-CON M) CR tablet 40 mEq (40 mEq Oral Given 01/09/22 0946)  magnesium sulfate IVPB 4 g 100 mL (4 g Intravenous New Bag/Given 01/10/22 0912)  potassium chloride SA (KLOR-CON M) CR tablet 40 mEq (40 mEq Oral Given 01/10/22 0906)  potassium PHOSPHATE 30 mmol in dextrose 5 % 500 mL infusion (30 mmol Intravenous New Bag/Given 01/10/22 1038)  potassium chloride SA (KLOR-CON M) CR tablet 40 mEq (40 mEq Oral Given 01/11/22 1313)  magnesium sulfate IVPB 2 g 50 mL (2 g Intravenous New Bag/Given 01/11/22 0853)    ED Course/ Medical Decision Making/ A&P Clinical Course as of 01/13/22 2314  Thu Jan 07, 2022  2049 CMP as well as lipase was sent multiple times to the lab.  Lab reported patient had such hyperlipidemia that RBCs hemolyzed before they were able to be run.  They suggested i-STAT Chem-8 for assessment of renal function [CR]    Clinical Course User Index [CR] Wilnette Kales, PA                           Medical  Decision Making Amount and/or Complexity of Data Reviewed Labs: ordered. Radiology: ordered.  Risk Prescription drug management. Decision regarding hospitalization.   This patient presents to the ED for concern of abdominal pain, this involves an extensive number of treatment options, and is a complaint that carries with it a high risk of complications and morbidity.  The differential diagnosis includes The causes of generalized abdominal pain include but are not limited to AAA, mesenteric ischemia, appendicitis, diverticulitis, DKA, gastritis, gastroenteritis, AMI, nephrolithiasis, pancreatitis,  peritonitis, constipation, UTI,SBO/LBO, biliary disease, IBD, IBS, PUD, or hepatitis. Ectopic pregnancy, ovarian torsion, PID.    Co morbidities that complicate the patient evaluation  See HPI   Additional history obtained:  Additional history obtained from EMR  Lab Tests:  I Ordered, and personally interpreted labs.  The pertinent results include: Mild leukocytosis of 12.8 with hemoglobin of 15.2.  CMP indicative of decreased bicarb of 14 as well as elevated ion gap of 17 with blood glucose of 303 indicative of hyperglycemia.  VBG significant for pH of 7.336 with decreased bicarb of 17.7, decreased PCO2 venous of 33.1 and increased PO2 of 5.  Beta hydroxybutyrate pending upon admission.  Urinalysis significant for greater than 500 glucose, 80 ketones and 100 protein with no evidence of infection or hematuria.  Beta-hCG less than 5.  Lipid panel pending upon admission.   Imaging Studies ordered:  I ordered imaging studies including CT abdomen pelvis I independently visualized and interpreted imaging which showed marked thickening of the second and third part of the duodenum with fat stranding edema about the duodenal in head of pancreas indicative of severe duodenitis or pancreatitis.  No active luminal free air to suggest duodenal ulceration/perforation.  Bowel loops are normal in caliber.   Normal appendix.  No evidence of nephrolithiasis or hydronephrosis.  Uterus and adnexa are unremarkable. I agree with the radiologist interpretation  Cardiac Monitoring: / EKG:  The patient was maintained on a cardiac monitor.  I personally viewed and interpreted the cardiac monitored which showed an underlying rhythm of: Sinus rhythm,   Consultations Obtained:  See ED course  Problem List / ED Course / Critical interventions / Medication management  Pancreatitis/hyperglycemia I ordered medication including 1 L normal saline for rehydration.  Morphine for pain and Zofran for nausea.   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 Reevaluation of the patient after these medicines showed that the patient improved I have reviewed the patients home medicines and have made adjustments as needed   Social Determinants of Health:  Denies tobacco, illicit drug use, alcohol use.  Test / Admission - Considered:  Triglyceride induced pancreatitis  Vitals signs significant for initial hypertension with blood pressure 150/100 which decreased within normal range with administration of pain medication as well as fluids.. Otherwise within normal range and stable throughout visit. 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  Assumed hypertriglyceridemia given multiple laboratory reporting of difficulty evaluating patient's blood work multiple times due to white opaque substance present when blood was spun down consistent with hypertriglyceridemia. Lipid panel, beta-hydroxybutyrate and lipase still pending upon admission.  Hospital Dr. Bridgett Larsson was consulted regarding the patient and he agreed with admission of the patient assuming further treatment/care. Treatment plan  were discussed at length with patient and they knowledge understanding was agreeable to said plan.  Appropriate consultations were made as described in the ED course.  Patient was stable upon admission to the hospital.        Final Clinical Impression(s) / ED Diagnoses Final diagnoses:  Acute pancreatitis, unspecified complication status, unspecified pancreatitis type  Hyperglycemia    Rx / DC Orders ED Discharge Orders          Ordered    insulin isophane & regular human KwikPen (NOVOLIN 70/30 KWIKPEN) (70-30) 100 UNIT/ML KwikPen  2 times daily  01/12/22 1004    fenofibrate 160 MG tablet  Daily        01/12/22 0959    ezetimibe (ZETIA) 10 MG tablet  Daily        01/12/22 0959    insulin aspart protamine - aspart (NOVOLOG 70/30 MIX) (70-30) 100 UNIT/ML FlexPen  2 times daily with meals        01/12/22 0959    Insulin Pen Needle 32G X 4 MM MISC  As needed        01/12/22 0959    metFORMIN (GLUCOPHAGE) 500 MG tablet  2 times daily with meals        01/12/22 0959    blood glucose meter kit and supplies        01/12/22 0959    Increase activity slowly        01/12/22 0959    Discharge instructions       Comments: Follow with Primary MD  Constant, Peggy, MD in 1-2 weeks  Please check your CBGs multiple times a day-keep a record of your readings and take it to your primary care practitioner at your next appointment.  Please get a complete blood count and chemistry panel checked by your Primary MD at your next visit, and again as instructed by your Primary MD.  Get Medicines reviewed and adjusted: Please take all your medications with you for your next visit with your Primary MD  Laboratory/radiological data: Please request your Primary MD to go over all hospital tests and procedure/radiological results at the follow up, please ask your Primary MD to get all Hospital records sent to his/her office.  In some cases, they will be blood work, cultures and biopsy results  pending at the time of your discharge. Please request that your primary care M.D. follows up on these results.  Also Note the following: If you experience worsening of your admission symptoms, develop shortness of breath, life threatening emergency, suicidal or homicidal thoughts you must seek medical attention immediately by calling 911 or calling your MD immediately  if symptoms less severe.  You must read complete instructions/literature along with all the possible adverse reactions/side effects for all the Medicines you take and that have been prescribed to you. Take any new Medicines after you have completely understood and accpet all the possible adverse reactions/side effects.   Do not drive when taking Pain medications or sleeping medications (Benzodaizepines)  Do not take more than prescribed Pain, Sleep and Anxiety Medications. It is not advisable to combine anxiety,sleep and pain medications without talking with your primary care practitioner  Special Instructions: If you have smoked or chewed Tobacco  in the last 2 yrs please stop smoking, stop any regular Alcohol  and or any Recreational drug use.  Wear Seat belts while driving.  Please note: You were cared for by a hospitalist during your hospital stay. Once you are discharged, your primary care physician will handle any further medical issues. Please note that NO REFILLS for any discharge medications will be authorized once you are discharged, as it is imperative that you return to your primary care physician (or establish a relationship with a primary care physician if you do not have one) for your post hospital discharge needs so that they can reassess your need for medications and monitor your lab values.   01/12/22 0959    Diet Carb Modified        01/12/22 0959    Insulin Pen Needle 32G X 4 MM MISC  As needed        01/12/22 1004    Amb Referral to Nutrition and Diabetic Education        01/10/22 [place], [place], [place] 01/13/22 2315    Cristie Hem, MD 01/15/22 1505

## 2022-01-07 NOTE — ED Triage Notes (Signed)
Complains of epigastric pain.  Reports it started today.;  Complains of n/v  Reports pain radiates into back.  d

## 2022-01-08 ENCOUNTER — Encounter (HOSPITAL_COMMUNITY): Payer: Self-pay | Admitting: Internal Medicine

## 2022-01-08 DIAGNOSIS — E781 Pure hyperglyceridemia: Secondary | ICD-10-CM

## 2022-01-08 DIAGNOSIS — K858 Other acute pancreatitis without necrosis or infection: Secondary | ICD-10-CM

## 2022-01-08 DIAGNOSIS — K859 Acute pancreatitis without necrosis or infection, unspecified: Secondary | ICD-10-CM | POA: Diagnosis present

## 2022-01-08 DIAGNOSIS — E119 Type 2 diabetes mellitus without complications: Secondary | ICD-10-CM

## 2022-01-08 LAB — BASIC METABOLIC PANEL
Anion gap: 7 (ref 5–15)
BUN: 7 mg/dL (ref 6–20)
CO2: 19 mmol/L — ABNORMAL LOW (ref 22–32)
Calcium: 8.3 mg/dL — ABNORMAL LOW (ref 8.9–10.3)
Chloride: 109 mmol/L (ref 98–111)
Creatinine, Ser: 0.47 mg/dL (ref 0.44–1.00)
GFR, Estimated: >60 mL/min (ref >60)
Glucose, Bld: 110 mg/dL — ABNORMAL HIGH (ref 70–99)
Potassium: 3.1 mmol/L — ABNORMAL LOW (ref 3.5–5.1)
Sodium: 135 mmol/L (ref 135–145)

## 2022-01-08 LAB — CBG MONITORING, ED
Glucose-Capillary: 292 mg/dL — ABNORMAL HIGH (ref 70–99)
Glucose-Capillary: 291 mg/dL — ABNORMAL HIGH (ref 70–99)
Glucose-Capillary: 254 mg/dL — ABNORMAL HIGH (ref 70–99)
Glucose-Capillary: 242 mg/dL — ABNORMAL HIGH (ref 70–99)
Glucose-Capillary: 209 mg/dL — ABNORMAL HIGH (ref 70–99)
Glucose-Capillary: 188 mg/dL — ABNORMAL HIGH (ref 70–99)

## 2022-01-08 LAB — COMPREHENSIVE METABOLIC PANEL
ALT: 13 U/L (ref 0–44)
AST: 23 U/L (ref 15–41)
Albumin: 2.6 g/dL — ABNORMAL LOW (ref 3.5–5.0)
Alkaline Phosphatase: 78 U/L (ref 38–126)
Anion gap: 7 (ref 5–15)
BUN: 7 mg/dL (ref 6–20)
CO2: 18 mmol/L — ABNORMAL LOW (ref 22–32)
Calcium: 8.5 mg/dL — ABNORMAL LOW (ref 8.9–10.3)
Chloride: 108 mmol/L (ref 98–111)
Creatinine, Ser: 0.4 mg/dL — ABNORMAL LOW (ref 0.44–1.00)
GFR, Estimated: >60 mL/min (ref >60)
Glucose, Bld: 144 mg/dL — ABNORMAL HIGH (ref 70–99)
Potassium: 3.7 mmol/L (ref 3.5–5.1)
Sodium: 133 mmol/L — ABNORMAL LOW (ref 135–145)
Total Bilirubin: 1.1 mg/dL (ref 0.3–1.2)
Total Protein: 5.5 g/dL — ABNORMAL LOW (ref 6.5–8.1)

## 2022-01-08 LAB — LDL CHOLESTEROL, DIRECT: Direct LDL: UNDETERMINED mg/dL (ref 0–99)

## 2022-01-08 LAB — GLUCOSE, CAPILLARY
Glucose-Capillary: 175 mg/dL — ABNORMAL HIGH (ref 70–99)
Glucose-Capillary: 158 mg/dL — ABNORMAL HIGH (ref 70–99)
Glucose-Capillary: 162 mg/dL — ABNORMAL HIGH (ref 70–99)
Glucose-Capillary: 112 mg/dL — ABNORMAL HIGH (ref 70–99)
Glucose-Capillary: 97 mg/dL (ref 70–99)
Glucose-Capillary: 98 mg/dL (ref 70–99)
Glucose-Capillary: 107 mg/dL — ABNORMAL HIGH (ref 70–99)
Glucose-Capillary: 95 mg/dL (ref 70–99)
Glucose-Capillary: 81 mg/dL (ref 70–99)

## 2022-01-08 LAB — LIPID PANEL
Cholesterol: 472 mg/dL — ABNORMAL HIGH (ref 0–200)
Triglycerides: 3480 mg/dL — ABNORMAL HIGH (ref <150)
VLDL: UNDETERMINED mg/dL (ref 0–40)

## 2022-01-08 LAB — CBC
HCT: 37.5 % (ref 36.0–46.0)
Hemoglobin: 13.1 g/dL (ref 12.0–15.0)
MCH: 29.8 pg (ref 26.0–34.0)
MCHC: 34.9 g/dL (ref 30.0–36.0)
MCV: 85.4 fL (ref 80.0–100.0)
Platelets: 194 10*3/uL (ref 150–400)
RBC: 4.39 MIL/uL (ref 3.87–5.11)
RDW: 12.9 % (ref 11.5–15.5)
WBC: 10.8 10*3/uL — ABNORMAL HIGH (ref 4.0–10.5)
nRBC: 0 % (ref 0.0–0.2)

## 2022-01-08 LAB — MAGNESIUM: Magnesium: 1.7 mg/dL (ref 1.7–2.4)

## 2022-01-08 LAB — BETA-HYDROXYBUTYRIC ACID: Beta-Hydroxybutyric Acid: 4.3 mmol/L — ABNORMAL HIGH (ref 0.05–0.27)

## 2022-01-08 LAB — LACTIC ACID, PLASMA: Lactic Acid, Venous: 1 mmol/L (ref 0.5–1.9)

## 2022-01-08 MED ORDER — BISACODYL 10 MG RE SUPP: 10.0000 mg | Freq: Every day | RECTAL | Status: DC | PRN

## 2022-01-08 MED ORDER — ATORVASTATIN CALCIUM 80 MG PO TABS
80.0000 mg | ORAL_TABLET | Freq: Every day | ORAL | Status: DC
  Administered 2022-01-08 – 2022-01-12 (×5): 80 mg via ORAL
  Filled 2022-01-08 (×3): qty 1
  Filled 2022-01-08: qty 2
  Filled 2022-01-08: qty 1

## 2022-01-08 MED ORDER — SENNOSIDES-DOCUSATE SODIUM 8.6-50 MG PO TABS: 2.0000 | ORAL_TABLET | Freq: Every evening | ORAL | Status: DC | PRN

## 2022-01-08 MED ORDER — SODIUM CHLORIDE 0.9 % IV BOLUS
1000.0000 mL | Freq: Once | INTRAVENOUS | Status: AC
  Administered 2022-01-08: 1000 mL via INTRAVENOUS

## 2022-01-08 MED ORDER — FENOFIBRATE 160 MG PO TABS
160.0000 mg | ORAL_TABLET | Freq: Every day | ORAL | Status: DC
  Administered 2022-01-08 – 2022-01-12 (×5): 160 mg via ORAL
  Filled 2022-01-08 (×7): qty 1

## 2022-01-08 MED ORDER — ACETAMINOPHEN 325 MG PO TABS
650.0000 mg | ORAL_TABLET | Freq: Four times a day (QID) | ORAL | Status: DC | PRN
  Administered 2022-01-08 – 2022-01-12 (×4): 650 mg via ORAL
  Filled 2022-01-08 (×4): qty 2

## 2022-01-08 MED ORDER — HYDROMORPHONE HCL 1 MG/ML IJ SOLN
1.0000 mg | INTRAMUSCULAR | Status: DC | PRN
  Administered 2022-01-08 – 2022-01-11 (×9): 1 mg via INTRAVENOUS
  Filled 2022-01-08 (×9): qty 1

## 2022-01-08 MED ORDER — SODIUM CHLORIDE 0.9 % IV SOLN: INTRAVENOUS | Status: DC

## 2022-01-08 MED ORDER — DEXTROSE 5 % IV SOLN: INTRAVENOUS | Status: DC

## 2022-01-08 MED ORDER — ONDANSETRON HCL 4 MG/2ML IJ SOLN: 4.0000 mg | Freq: Four times a day (QID) | INTRAMUSCULAR | Status: DC | PRN

## 2022-01-08 MED ORDER — POLYETHYLENE GLYCOL 3350 17 G PO PACK
17.0000 g | PACK | Freq: Every day | ORAL | Status: DC
  Administered 2022-01-08 – 2022-01-10 (×2): 17 g via ORAL
  Filled 2022-01-08 (×5): qty 1

## 2022-01-08 MED ORDER — MAGNESIUM SULFATE IN D5W 1-5 GM/100ML-% IV SOLN
1.0000 g | Freq: Once | INTRAVENOUS | Status: AC
Start: 2022-01-08 — End: 2022-01-09
  Administered 2022-01-08: 1 g via INTRAVENOUS
  Filled 2022-01-08: qty 100

## 2022-01-08 MED ORDER — POTASSIUM CHLORIDE 10 MEQ/100ML IV SOLN: 10.0000 meq | INTRAVENOUS | Status: DC

## 2022-01-08 MED ORDER — EZETIMIBE 10 MG PO TABS
10.0000 mg | ORAL_TABLET | Freq: Every day | ORAL | Status: DC
  Administered 2022-01-08 – 2022-01-12 (×5): 10 mg via ORAL
  Filled 2022-01-08 (×5): qty 1

## 2022-01-08 MED ORDER — TRIMETHOBENZAMIDE HCL 100 MG/ML IM SOLN: 200.0000 mg | Freq: Three times a day (TID) | INTRAMUSCULAR | Status: DC | PRN

## 2022-01-08 MED ORDER — ENOXAPARIN SODIUM 40 MG/0.4ML IJ SOSY
40.0000 mg | PREFILLED_SYRINGE | INTRAMUSCULAR | Status: DC
  Administered 2022-01-08 – 2022-01-12 (×5): 40 mg via SUBCUTANEOUS
  Filled 2022-01-08 (×5): qty 0.4

## 2022-01-08 MED ORDER — POTASSIUM CHLORIDE 10 MEQ/100ML IV SOLN
10.0000 meq | INTRAVENOUS | Status: AC
  Administered 2022-01-08 – 2022-01-09 (×6): 10 meq via INTRAVENOUS
  Filled 2022-01-08 (×6): qty 100

## 2022-01-08 MED ORDER — INSULIN (MYXREDLIN) INFUSION FOR HYPERTRIGLYCERIDEMIA
0.1000 [IU]/kg/h | INTRAVENOUS | Status: DC
  Administered 2022-01-08 – 2022-01-09 (×3): 0.1 [IU]/kg/h via INTRAVENOUS
  Filled 2022-01-08 (×3): qty 100

## 2022-01-08 NOTE — Assessment & Plan Note (Signed)
Admit to progressive bed.  Likely her pancreatitis is due to hypertriglyceridemia.  Currently awaiting her lipid panel.  May need to be on an insulin drip to help with her triglycerides.  Will admit to progressive bed in case she needs an insulin drip.  Keep n.p.o.  Continue IV fluids.  IV Dilaudid for pain.  IV Protonix for duodenitis.

## 2022-01-08 NOTE — H&P (Signed)
History and Physical    Shelia Ford NOM:[id number] DOB: [birth year] DOA: 01/07/2022  DOS: the patient was seen and examined on 01/07/2022  PCP: Catalina Antigua, MD   Patient coming from: Home  I have personally briefly reviewed patient's old medical records in [facility]  CC: abd pain HPI: 31 year old Hispanic female history of diabetes type 2 on metformin and glipizide, history of hypertriglyceridemia on fenofibrate, presents to the ER today with a sudden onset of abdominal pain starting around 7 or 8:00 this morning.  She has had some nausea and vomiting.  She has had some chills but no fever.  She has had no diarrhea.  Pain is epigastric with radiation to her back.  She has never had pain like this before.  He has never been admitted to the hospital for pancreatitis.  She does not drink alcohol.  She does not smoke cigarettes.  Upon presentation temp 98.7 heart rate 98 blood pressure 150/100 satting 100% on room air.  Weight was 73.5 kg.  Patient's lab consistently showed hemolysis.  They were unable to draw a lipase level.  White count 12.8, hemoglobin 15.2, platelets of 289  UA showed a specific gravity 1.039, glucose greater than 500, ketones 80 negative nitrite, leukocyte esterase.  I-STAT Chem-7 showed a sodium 135, BUN of 5, creatinine 0.2  Beta hydroxybutyric acid drawn but still pending.  CT abdomen pelvis which I personally reviewed demonstrates pancreatitis with duodenitis  Used video spanish interpretor Elita Quick [phone]    ED Course: Lipase unable to be drawn in the ER.  I-STAT performed.  Venous pH 7.33, PCO2 33.  Capillary glucose of 303.  Hemolyzed chemistry showed a bicarb of 14.  ER provider started insulin drip.  No evidence of DKA.  Review of Systems:  Review of Systems  Constitutional:  Positive for chills. Negative for fever.  HENT: Negative.    Eyes: Negative.   Respiratory: Negative.    Cardiovascular: Negative.   Gastrointestinal:   Positive for abdominal pain, nausea and vomiting. Negative for diarrhea.  Genitourinary: Negative.   Musculoskeletal: Negative.   Skin: Negative.   Neurological: Negative.   Endo/Heme/Allergies: Negative.   Psychiatric/Behavioral: Negative.    All other systems reviewed and are negative.   Past Medical History:  Diagnosis Date   Diabetes mellitus without complication (HCC)    History of 3 cesarean sections 02/08/2014   Needs repeat   Hyperlipidemia    Hypertriglyceridemia    Medical history non-contributory    Postprocedural pelvic peritoneal adhesions    4 prior c/s    Past Surgical History:  Procedure Laterality Date   CESAREAN SECTION     C/S x 4--low vertical T extension with 4th.   CESAREAN SECTION N/A 03/22/2014   Procedure: CESAREAN SECTION;  Surgeon: Tilda Burrow, MD;  Location: [facility];  Service: Obstetrics;  Laterality: N/A;     reports that she has never smoked. She has never used smokeless tobacco. She reports that she does not drink alcohol and does not use drugs.  No Known Allergies  Family History  Problem Relation Age of Onset   Diabetes Mother    Diabetes Maternal Aunt     Prior to Admission medications   Medication Sig Start Date End Date Taking? Authorizing Provider  acetaminophen (TYLENOL 8 HOUR) 650 MG CR tablet Take 1 tablet (650 mg total) by mouth every 8 (eight) hours as needed for pain. 10/21/18   Petrucelli, Samantha R, PA-C  benzonatate (TESSALON) 100 MG capsule  Take 1 capsule (100 mg total) by mouth every 8 (eight) hours. 10/21/18   Petrucelli, Samantha R, PA-C  fluticasone (FLONASE) 50 MCG/ACT nasal spray Place 1 spray into both nostrils daily. 10/21/18   Petrucelli, Samantha R, PA-C  metFORMIN (GLUCOPHAGE) 500 MG tablet Take by mouth 2 (two) times daily with a meal.    [provider]  oxyCODONE-acetaminophen (PERCOCET/ROXICET) 5-325 MG per tablet Take 1 tablet by mouth every 4 (four) hours as needed (for pain scale less than  7). Patient not taking: Reported on 11/25/2016 03/24/14   Fredirick Lathe, MD    Physical Exam: Vitals:   01/07/22 1703 01/07/22 1712 01/07/22 2345 01/07/22 2346  BP:  133/87  (!) 142/101  Pulse:  97  (!) 128  Resp:  20  16  Temp:  98.2 F (36.8 C) 99.1 F (37.3 C)   TempSrc:  Oral Oral   SpO2: 97% 99%  97%  Weight:      Height:        Physical Exam Vitals and nursing note reviewed.  Constitutional:      General: She is not in acute distress.    Appearance: Normal appearance. She is not ill-appearing, toxic-appearing or diaphoretic.  HENT:     Head: Normocephalic and atraumatic.     Nose: Nose normal.  Eyes:     General: No scleral icterus. Cardiovascular:     Rate and Rhythm: Regular rhythm. Tachycardia present.     Pulses: Normal pulses.  Pulmonary:     Effort: Pulmonary effort is normal. No respiratory distress.     Breath sounds: Normal breath sounds. No wheezing or rales.  Abdominal:     General: Bowel sounds are normal.     Tenderness: There is generalized abdominal tenderness. There is no guarding or rebound.  Musculoskeletal:     Right lower leg: No edema.     Left lower leg: No edema.  Skin:    General: Skin is warm and dry.     Capillary Refill: Capillary refill takes less than 2 seconds.  Neurological:     General: No focal deficit present.     Mental Status: She is alert and oriented to person, place, and time.      Labs on Admission: I have personally reviewed following labs and imaging studies  CBC: Recent Labs  Lab 01/07/22 1501 01/07/22 2052 01/07/22 2255  WBC 12.8*  --   --   HGB 15.2* 15.0 15.0  HCT 44.0 44.0 44.0  MCV 84.8  --   --   PLT 289  --   --    Basic Metabolic Panel: Recent Labs  Lab 01/07/22 1426 01/07/22 2052 01/07/22 2255  NA 136 135 135  K SPECIMEN HEMOLYZED. HEMOLYSIS MAY AFFECT INTEGRITY OF RESULTS. 5.0 4.3  CL 105 108  --   CO2 14*  --   --   GLUCOSE 286* 274*  --   BUN 8 5*  --   CREATININE 0.51 0.20*  --    CALCIUM 8.8*  --   --    GFR: Estimated Creatinine Clearance: 96.9 mL/min (A) (by C-G formula based on SCr of 0.2 mg/dL (L)). Liver Function Tests: Recent Labs  Lab 01/07/22 1426  AST SPECIMEN HEMOLYZED. HEMOLYSIS MAY AFFECT INTEGRITY OF RESULTS.  ALT SPECIMEN HEMOLYZED. HEMOLYSIS MAY AFFECT INTEGRITY OF RESULTS.  ALKPHOS SPECIMEN HEMOLYZED. HEMOLYSIS MAY AFFECT INTEGRITY OF RESULTS.  BILITOT SPECIMEN HEMOLYZED. HEMOLYSIS MAY AFFECT INTEGRITY OF RESULTS.  PROT SPECIMEN HEMOLYZED. HEMOLYSIS MAY AFFECT INTEGRITY  OF RESULTS.  ALBUMIN 4.0   No results for input(s): "LIPASE", "AMYLASE" in the last 168 hours. No results for input(s): "AMMONIA" in the last 168 hours. Coagulation Profile: No results for input(s): "INR", "PROTIME" in the last 168 hours. Cardiac Enzymes: No results for input(s): "CKTOTAL", "CKMB", "CKMBINDEX", "TROPONINI", "TROPONINIHS" in the last 168 hours. BNP (last 3 results) No results for input(s): "PROBNP" in the last 8760 hours. HbA1C: No results for input(s): "HGBA1C" in the last 72 hours. CBG: Recent Labs  Lab 01/07/22 1613 01/07/22 2238  GLUCAP 244* 303*   Lipid Profile: No results for input(s): "CHOL", "HDL", "LDLCALC", "TRIG", "CHOLHDL", "LDLDIRECT" in the last 72 hours. Thyroid Function Tests: No results for input(s): "TSH", "T4TOTAL", "FREET4", "T3FREE", "THYROIDAB" in the last 72 hours. Anemia Panel: No results for input(s): "VITAMINB12", "FOLATE", "FERRITIN", "TIBC", "IRON", "RETICCTPCT" in the last 72 hours. Urine analysis:    Component Value Date/Time   COLORURINE YELLOW 01/07/2022 1426   APPEARANCEUR CLEAR 01/07/2022 1426   LABSPEC 1.039 (H) 01/07/2022 1426   PHURINE 5.0 01/07/2022 1426   GLUCOSEU >=500 (A) 01/07/2022 1426   HGBUR NEGATIVE 01/07/2022 1426   BILIRUBINUR NEGATIVE 01/07/2022 1426   KETONESUR 80 (A) 01/07/2022 1426   PROTEINUR 100 (A) 01/07/2022 1426   NITRITE NEGATIVE 01/07/2022 1426   LEUKOCYTESUR NEGATIVE 01/07/2022 1426     Radiological Exams on Admission: I have personally reviewed images CT Abdomen Pelvis W Contrast  Result Date: 01/07/2022 CLINICAL DATA:  Abdominal pain. EXAM: CT ABDOMEN AND PELVIS WITH CONTRAST TECHNIQUE: Multidetector CT imaging of the abdomen and pelvis was performed using the standard protocol following bolus administration of intravenous contrast. RADIATION DOSE REDUCTION: This exam was performed according to the departmental dose-optimization program which includes automated exposure control, adjustment of the mA and/or kV according to patient size and/or use of iterative reconstruction technique. CONTRAST:  OMNIPAQUE IOHEXOL 300 MG/ML  SOLN COMPARISON:  None Available. FINDINGS: Lower chest: No acute abnormality. Hepatobiliary: No focal liver abnormality is seen. No gallstones, gallbladder wall thickening, or biliary dilatation. Pancreas: There is edema about the head of the pancreas. Homogeneous perfusion of the pancreas without evidence of necrosis or pancreatic ductal dilatation. Spleen: Normal in size without focal abnormality. Adrenals/Urinary Tract: Adrenal glands are unremarkable. Kidneys are normal, without renal calculi, focal lesion, or hydronephrosis. Bladder is unremarkable. Stomach/Bowel: Stomach is within normal limits. There is marked thickening of the second, third part of the duodenum about the head of the pancreas with edema and inflammatory changes about the duodenum and head of the pancreas. Small bowel loops are normal in caliber. Appendix appears normal. No evidence of bowel wall thickening, distention, or inflammatory changes. Vascular/Lymphatic: No significant vascular findings are present. Portal venous system is patent. No enlarged abdominal or pelvic lymph nodes. Reproductive: Uterus and bilateral adnexa are unremarkable. Other: No abdominal wall hernia or abnormality. No abdominopelvic ascites. There is no extraluminal free air. Musculoskeletal: No acute or  significant osseous findings. IMPRESSION: 1. Marked wall thickening of the second and third part of the duodenum with fat stranding and edema about the duodenal and head of the pancreas. The findings are likely secondary to severe duodenitis. Differential also includes pancreatitis with associated inflammation of the duodenum. Correlation with serum lipase/amylase would be helpful for further evaluation. 2. No extraluminal free air to suggest duodenal ulceration/perforation. 3.  Bowel loops are normal in caliber.  Normal appendix. 4.  No evidence of nephrolithiasis or hydronephrosis. 5.  Uterus and adnexa are unremarkable. Electronically Signed   By:  Imran  Ahmed D.O.   On: 01/07/2022 22:00    EKG: My personal interpretation of EKG shows: no EKG performed  Assessment/Plan Principal Problem:   Acute pancreatitis Active Problems:   Diabetes mellitus without complication (HCC)   Hypertriglyceridemia    Assessment and Plan: * Acute pancreatitis Admit to progressive bed.  Likely her pancreatitis is due to hypertriglyceridemia.  Currently awaiting her lipid panel.  May need to be on an insulin drip to help with her triglycerides.  Will admit to progressive bed in case she needs an insulin drip.  Keep n.p.o.  Continue IV fluids.  IV Dilaudid for pain.  IV Protonix for duodenitis.  Hypertriglyceridemia History of hypertriglyceridemia.  She states her triglycerides are about 700.  She is taking fenofibrate at home but on a statin.  Awaiting her lipid panel to be drawn.  Diabetes mellitus without complication (HCC) Check A1c.  Holding glipizide and metformin.  May need to be on an insulin drip for her triglycerides.  We will hold off on sliding scale for now.  Stop the insulin drip that was started in the ER as she does not have DKA.   DVT prophylaxis: SQ Heparin Code Status: Full Code Family Communication: discussed with pt and husband via video spanish interpretor Elita Quick [phone]  Disposition Plan:  return home  Consults called: none  Admission status: Inpatient, Telemetry bed   Carollee Herter, DO [facility] 01/08/2022, 12:36 AM

## 2022-01-08 NOTE — Progress Notes (Signed)
  Spoke with lab. Pt's blood is lipemic. They need to centrifuge it and perform serial dilutions. Will start insulin gtts protocol for hypertriglyceridemia.  By verbal report, TG are >3400  Carollee Herter, DO [facility]

## 2022-01-08 NOTE — ED Notes (Addendum)
Pt's lab sample keeps on getting hemolyzed d/t her high trig level. MD made aware. MD instructed to  attempt again tmrw morning.

## 2022-01-08 NOTE — Assessment & Plan Note (Signed)
History of hypertriglyceridemia.  She states her triglycerides are about 700.  She is taking fenofibrate at home but on a statin.  Awaiting her lipid panel to be drawn.

## 2022-01-08 NOTE — Assessment & Plan Note (Signed)
Check A1c.  Holding glipizide and metformin.  May need to be on an insulin drip for her triglycerides.  We will hold off on sliding scale for now.  Stop the insulin drip that was started in the ER as she does not have DKA.

## 2022-01-08 NOTE — Significant Event (Signed)
Rapid Response Event Note   Reason for Call :  Asked to see pt as second set of eyes d/t questionable need for higher level of care.   Initial Focused Assessment:  Pt lying in bed with eyes open, in no visible distress. She is alert and oriented, c/o 7/10 pain in ABD. Breathing is unlabored, lungs clear t/o. ABD tender but soft. Skin warm and dry.  T-98.9, HR-130s, BP-110/77, RR-19, SpO2-94% on RA.   Interventions:  EKG-ST with short PR, long QTC 1L NS bolus D5W increased to 75cc/hr U/S PIV placed by IV team Zofran d/c'd d/t prolonged qtc-Tigan ordered CBC/BMP/Mg/LA now if able. Plan of Care:  Pt is tachycardiac but stable. Bolus is infusing. Continue to monitor pt closely. Please call RRT if assistance needed.   Event Summary:   MD Notified: Dr. Loney Loh Call [phone] Arrival Time:2000 End Time:2030  Terrilyn Saver, RN

## 2022-01-08 NOTE — Progress Notes (Signed)
PROGRESS NOTE        PATIENT DETAILS Name: Shelia Ford Age: 31 y.o. Sex: female Date of Birth: 24-Nov-1990 Admit Date: 01/07/2022 Admitting Physician Carollee Herter, DO VZD:GLOVFIEP, Gigi Gin, MD  Brief Summary: Patient is a 31 y.o.  female with history of DM-2, HLD-who presented with epigastric pain-found to have pancreatitis due to severe hypertriglyceridemia.   Significant events: 8/31>> epigastric pain- pancreatitis due to hypertriglyceridemia-admit to [facility].  Significant studies: 8/31>> CT abdomen/pelvis: Marked thickening of second/third portion of the duodenum with fat stranding/edema about the duodenum and head of the pancreas.  Significant microbiology data: None  Procedures: None  Consults: None  Subjective: Appears comfortable-continues to have upper epigastric pain.  1 episode of vomiting overnight.  Objective: Vitals: Blood pressure (!) 121/91, pulse (!) 138, temperature 98.8 F (37.1 C), temperature source Oral, resp. rate 19, height 5\' 3"  (1.6 m), weight 73.5 kg, SpO2 99 %, unknown if currently breastfeeding.   Exam: Gen Exam:Alert awake-not in any distress HEENT:atraumatic, normocephalic Chest: B/L clear to auscultation anteriorly CVS:S1S2 regular Abdomen: Soft-mildly tender in the epigastric area without any peritoneal signs. Extremities:no edema Neurology: Non focal Skin: no rash  Pertinent Labs/Radiology:    Latest Ref Rng & Units 01/07/2022   10:55 PM 01/07/2022    8:52 PM 01/07/2022    3:01 PM  CBC  WBC 4.0 - 10.5 K/uL   12.8   Hemoglobin 12.0 - 15.0 g/dL 01/09/2022  32.9  51.8   Hematocrit 36.0 - 46.0 % 44.0  44.0  44.0   Platelets 150 - 400 K/uL   289     Lab Results  Component Value Date   NA 135 01/07/2022   K 4.3 01/07/2022   CL 108 01/07/2022   CO2 14 (L) 01/07/2022      Assessment/Plan: Acute pancreatitis due to hypertriglyceridemia: Appears essentially unchanged overnight-keep n.p.o.-continue  aggressive IVF resuscitation-continue IV insulin infusion-as needed narcotics/antiemetics.  If worsens-GI/renal consult will be obtained for plasmapheresis.  Hypertriglyceridemia: On insulin infusion-has been started on fenofibrate.  DM-2: Hold oral hypoglycemics on hold-on insulin infusion.  Recent Labs    01/08/22 0707 01/08/22 0824 01/08/22 1015  GLUCAP 291* 254* 242*    BMI: Estimated body mass index is 28.7 kg/m as calculated from the following:   Height as of this encounter: 5\' 3"  (1.6 m).   Weight as of this encounter: 73.5 kg.   Code status:   Code Status: Prior   DVT Prophylaxis: Lovenox   Family Communication: None at bedside   Disposition Plan: Status is: Inpatient Remains inpatient appropriate because: Acute pancreatitis due to hypertriglyceridemia-n.p.o./on IV insulin/IVF-and not stable for discharge.   Planned Discharge Destination:Home sometime next week.   Diet: Diet Order             Diet NPO time specified Except for: Ice Chips, Sips with Meds  Diet effective now                     Antimicrobial agents: Anti-infectives (From admission, onward)    None        MEDICATIONS: Scheduled Meds:  atorvastatin  80 mg Oral Daily   ezetimibe  10 mg Oral Daily   fenofibrate  160 mg Oral Daily   pantoprazole (PROTONIX) IV  40 mg Intravenous Q12H   polyethylene glycol  17 g Oral Daily   Continuous  Infusions:  sodium chloride 150 mL/hr at 01/08/22 0804   dextrose 50 mL/hr at 01/08/22 0803   insulin 0.1 Units/kg/hr (01/08/22 0603)   PRN Meds:.acetaminophen, bisacodyl, HYDROmorphone (DILAUDID) injection, ondansetron (ZOFRAN) IV, senna-docusate   I have personally reviewed following labs and imaging studies  LABORATORY DATA: CBC: Recent Labs  Lab 01/07/22 1501 01/07/22 2052 01/07/22 2255  WBC 12.8*  --   --   HGB 15.2* 15.0 15.0  HCT 44.0 44.0 44.0  MCV 84.8  --   --   PLT 289  --   --     Basic Metabolic Panel: Recent Labs   Lab 01/07/22 1426 01/07/22 2052 01/07/22 2255  NA 136 135 135  K SPECIMEN HEMOLYZED. HEMOLYSIS MAY AFFECT INTEGRITY OF RESULTS. 5.0 4.3  CL 105 108  --   CO2 14*  --   --   GLUCOSE 286* 274*  --   BUN 8 5*  --   CREATININE 0.51 0.20*  --   CALCIUM 8.8*  --   --     GFR: Estimated Creatinine Clearance: 96.9 mL/min (A) (by C-G formula based on SCr of 0.2 mg/dL (L)).  Liver Function Tests: Recent Labs  Lab 01/07/22 1426  AST SPECIMEN HEMOLYZED. HEMOLYSIS MAY AFFECT INTEGRITY OF RESULTS.  ALT SPECIMEN HEMOLYZED. HEMOLYSIS MAY AFFECT INTEGRITY OF RESULTS.  ALKPHOS SPECIMEN HEMOLYZED. HEMOLYSIS MAY AFFECT INTEGRITY OF RESULTS.  BILITOT SPECIMEN HEMOLYZED. HEMOLYSIS MAY AFFECT INTEGRITY OF RESULTS.  PROT SPECIMEN HEMOLYZED. HEMOLYSIS MAY AFFECT INTEGRITY OF RESULTS.  ALBUMIN 4.0   No results for input(s): "LIPASE", "AMYLASE" in the last 168 hours. No results for input(s): "AMMONIA" in the last 168 hours.  Coagulation Profile: No results for input(s): "INR", "PROTIME" in the last 168 hours.  Cardiac Enzymes: No results for input(s): "CKTOTAL", "CKMB", "CKMBINDEX", "TROPONINI" in the last 168 hours.  BNP (last 3 results) No results for input(s): "PROBNP" in the last 8760 hours.  Lipid Profile: Recent Labs    01/08/22 0316  CHOL 472*  HDL NOT REPORTED DUE TO HIGH TRIGLYCERIDES  LDLCALC NOT CALCULATED  TRIG 3,480*  CHOLHDL NOT REPORTED DUE TO HIGH TRIGLYCERIDES  LDLDIRECT UNABLE TO CALCULATE IF TRIGLYCERIDE IS >1293 mg/dL    Thyroid Function Tests: No results for input(s): "TSH", "T4TOTAL", "FREET4", "T3FREE", "THYROIDAB" in the last 72 hours.  Anemia Panel: No results for input(s): "VITAMINB12", "FOLATE", "FERRITIN", "TIBC", "IRON", "RETICCTPCT" in the last 72 hours.  Urine analysis:    Component Value Date/Time   COLORURINE YELLOW 01/07/2022 1426   APPEARANCEUR CLEAR 01/07/2022 1426   LABSPEC 1.039 (H) 01/07/2022 1426   PHURINE 5.0 01/07/2022 1426   GLUCOSEU  >=500 (A) 01/07/2022 1426   HGBUR NEGATIVE 01/07/2022 1426   BILIRUBINUR NEGATIVE 01/07/2022 1426   KETONESUR 80 (A) 01/07/2022 1426   PROTEINUR 100 (A) 01/07/2022 1426   NITRITE NEGATIVE 01/07/2022 1426   LEUKOCYTESUR NEGATIVE 01/07/2022 1426    Sepsis Labs: Lactic Acid, Venous No results found for: "LATICACIDVEN"  MICROBIOLOGY: No results found for this or any previous visit (from the past 240 hour(s)).  RADIOLOGY STUDIES/RESULTS: CT Abdomen Pelvis W Contrast  Result Date: 01/07/2022 CLINICAL DATA:  Abdominal pain. EXAM: CT ABDOMEN AND PELVIS WITH CONTRAST TECHNIQUE: Multidetector CT imaging of the abdomen and pelvis was performed using the standard protocol following bolus administration of intravenous contrast. RADIATION DOSE REDUCTION: This exam was performed according to the departmental dose-optimization program which includes automated exposure control, adjustment of the mA and/or kV according to patient size and/or use of iterative reconstruction  technique. CONTRAST:  OMNIPAQUE IOHEXOL 300 MG/ML  SOLN COMPARISON:  None Available. FINDINGS: Lower chest: No acute abnormality. Hepatobiliary: No focal liver abnormality is seen. No gallstones, gallbladder wall thickening, or biliary dilatation. Pancreas: There is edema about the head of the pancreas. Homogeneous perfusion of the pancreas without evidence of necrosis or pancreatic ductal dilatation. Spleen: Normal in size without focal abnormality. Adrenals/Urinary Tract: Adrenal glands are unremarkable. Kidneys are normal, without renal calculi, focal lesion, or hydronephrosis. Bladder is unremarkable. Stomach/Bowel: Stomach is within normal limits. There is marked thickening of the second, third part of the duodenum about the head of the pancreas with edema and inflammatory changes about the duodenum and head of the pancreas. Small bowel loops are normal in caliber. Appendix appears normal. No evidence of bowel wall thickening,  distention, or inflammatory changes. Vascular/Lymphatic: No significant vascular findings are present. Portal venous system is patent. No enlarged abdominal or pelvic lymph nodes. Reproductive: Uterus and bilateral adnexa are unremarkable. Other: No abdominal wall hernia or abnormality. No abdominopelvic ascites. There is no extraluminal free air. Musculoskeletal: No acute or significant osseous findings. IMPRESSION: 1. Marked wall thickening of the second and third part of the duodenum with fat stranding and edema about the duodenal and head of the pancreas. The findings are likely secondary to severe duodenitis. Differential also includes pancreatitis with associated inflammation of the duodenum. Correlation with serum lipase/amylase would be helpful for further evaluation. 2. No extraluminal free air to suggest duodenal ulceration/perforation. 3.  Bowel loops are normal in caliber.  Normal appendix. 4.  No evidence of nephrolithiasis or hydronephrosis. 5.  Uterus and adnexa are unremarkable. Electronically Signed   By: Larose Hires D.O.   On: 01/07/2022 22:00     LOS: 0 days   Jeoffrey Massed, MD  [facility]    To contact the attending provider between 7A-7P or the covering provider during after hours 7P-7A, please log into the web site [email or web address] and access using universal [place] password for that web site. If you do not have the password, please call the hospital operator.  01/08/2022, 10:14 AM

## 2022-01-08 NOTE — Progress Notes (Signed)
Charge RN aware and rapid nurse at bedside

## 2022-01-08 NOTE — Subjective & Objective (Addendum)
CC: abd pain HPI: 31 year old Hispanic female history of diabetes type 2 on metformin and glipizide, history of hypertriglyceridemia on fenofibrate, presents to the ER today with a sudden onset of abdominal pain starting around 7 or 8:00 this morning.  She has had some nausea and vomiting.  She has had some chills but no fever.  She has had no diarrhea.  Pain is epigastric with radiation to her back.  She has never had pain like this before.  He has never been admitted to the hospital for pancreatitis.  She does not drink alcohol.  She does not smoke cigarettes.  Upon presentation temp 98.7 heart rate 98 blood pressure 150/100 satting 100% on room air.  Weight was 73.5 kg.  Patient's lab consistently showed hemolysis.  They were unable to draw a lipase level.  White count 12.8, hemoglobin 15.2, platelets of 289  UA showed a specific gravity 1.039, glucose greater than 500, ketones 80 negative nitrite, leukocyte esterase.  I-STAT Chem-7 showed a sodium 135, BUN of 5, creatinine 0.2  Beta hydroxybutyric acid drawn but still pending.  CT abdomen pelvis which I personally reviewed demonstrates pancreatitis with duodenitis  Used video spanish interpretor Sheldon [phone]

## 2022-01-09 LAB — CBC
HCT: 33.9 % — ABNORMAL LOW (ref 36.0–46.0)
Hemoglobin: 11.8 g/dL — ABNORMAL LOW (ref 12.0–15.0)
MCH: 29.1 pg (ref 26.0–34.0)
MCHC: 34.8 g/dL (ref 30.0–36.0)
MCV: 83.5 fL (ref 80.0–100.0)
Platelets: 188 10*3/uL (ref 150–400)
RBC: 4.06 MIL/uL (ref 3.87–5.11)
RDW: 12.8 % (ref 11.5–15.5)
WBC: 9.6 10*3/uL (ref 4.0–10.5)
nRBC: 0 % (ref 0.0–0.2)

## 2022-01-09 LAB — TRIGLYCERIDES: Triglycerides: 263 mg/dL — ABNORMAL HIGH (ref <150)

## 2022-01-09 LAB — COMPREHENSIVE METABOLIC PANEL
ALT: 12 U/L (ref 0–44)
AST: 13 U/L — ABNORMAL LOW (ref 15–41)
Albumin: 2.3 g/dL — ABNORMAL LOW (ref 3.5–5.0)
Alkaline Phosphatase: 63 U/L (ref 38–126)
Anion gap: 4 — ABNORMAL LOW (ref 5–15)
BUN: <5 mg/dL — ABNORMAL LOW (ref 6–20)
CO2: 20 mmol/L — ABNORMAL LOW (ref 22–32)
Calcium: 8.1 mg/dL — ABNORMAL LOW (ref 8.9–10.3)
Chloride: 111 mmol/L (ref 98–111)
Creatinine, Ser: 0.34 mg/dL — ABNORMAL LOW (ref 0.44–1.00)
GFR, Estimated: >60 mL/min (ref >60)
Glucose, Bld: 98 mg/dL (ref 70–99)
Potassium: 3.1 mmol/L — ABNORMAL LOW (ref 3.5–5.1)
Sodium: 135 mmol/L (ref 135–145)
Total Bilirubin: 1 mg/dL (ref 0.3–1.2)
Total Protein: 5.2 g/dL — ABNORMAL LOW (ref 6.5–8.1)

## 2022-01-09 LAB — GLUCOSE, CAPILLARY
Glucose-Capillary: 100 mg/dL — ABNORMAL HIGH (ref 70–99)
Glucose-Capillary: 102 mg/dL — ABNORMAL HIGH (ref 70–99)
Glucose-Capillary: 106 mg/dL — ABNORMAL HIGH (ref 70–99)
Glucose-Capillary: 171 mg/dL — ABNORMAL HIGH (ref 70–99)
Glucose-Capillary: 185 mg/dL — ABNORMAL HIGH (ref 70–99)
Glucose-Capillary: 199 mg/dL — ABNORMAL HIGH (ref 70–99)
Glucose-Capillary: 225 mg/dL — ABNORMAL HIGH (ref 70–99)
Glucose-Capillary: 82 mg/dL (ref 70–99)
Glucose-Capillary: 97 mg/dL (ref 70–99)
Glucose-Capillary: 97 mg/dL (ref 70–99)
Glucose-Capillary: 97 mg/dL (ref 70–99)
Glucose-Capillary: 98 mg/dL (ref 70–99)

## 2022-01-09 LAB — PHOSPHORUS: Phosphorus: 2 mg/dL — ABNORMAL LOW (ref 2.5–4.6)

## 2022-01-09 LAB — MAGNESIUM: Magnesium: 1.6 mg/dL — ABNORMAL LOW (ref 1.7–2.4)

## 2022-01-09 LAB — LIPASE, BLOOD: Lipase: 82 U/L — ABNORMAL HIGH (ref 11–51)

## 2022-01-09 MED ORDER — POTASSIUM PHOSPHATES 15 MMOLE/5ML IV SOLN
30.0000 mmol | Freq: Once | INTRAVENOUS | Status: AC
  Administered 2022-01-09: 30 mmol via INTRAVENOUS
  Filled 2022-01-09: qty 10

## 2022-01-09 MED ORDER — INSULIN ASPART 100 UNIT/ML IJ SOLN
0.0000 [IU] | Freq: Three times a day (TID) | INTRAMUSCULAR | Status: DC
  Administered 2022-01-09: 2 [IU] via SUBCUTANEOUS
  Administered 2022-01-09: 3 [IU] via SUBCUTANEOUS
  Administered 2022-01-10: 5 [IU] via SUBCUTANEOUS
  Administered 2022-01-10 – 2022-01-11 (×3): 2 [IU] via SUBCUTANEOUS
  Administered 2022-01-11: 3 [IU] via SUBCUTANEOUS
  Administered 2022-01-11: 2 [IU] via SUBCUTANEOUS
  Administered 2022-01-12: 7 [IU] via SUBCUTANEOUS
  Administered 2022-01-12: 3 [IU] via SUBCUTANEOUS

## 2022-01-09 MED ORDER — MAGNESIUM SULFATE 4 GM/100ML IV SOLN
4.0000 g | Freq: Once | INTRAVENOUS | Status: AC
  Administered 2022-01-09: 4 g via INTRAVENOUS
  Filled 2022-01-09: qty 100

## 2022-01-09 MED ORDER — POTASSIUM CHLORIDE CRYS ER 20 MEQ PO TBCR
40.0000 meq | EXTENDED_RELEASE_TABLET | Freq: Once | ORAL | Status: AC
  Administered 2022-01-09: 40 meq via ORAL
  Filled 2022-01-09: qty 2

## 2022-01-09 NOTE — Progress Notes (Signed)
PROGRESS NOTE        PATIENT DETAILS Name: Shelia Ford Age: 31 y.o. Sex: female Date of Birth: Jan 11, 1991 Admit Date: 01/07/2022 Admitting Physician Carollee Herter, DO PIR:[id number], Gigi Gin, MD  Brief Summary: Patient is a 31 y.o.  female with history of DM-2, HLD-who presented with epigastric pain-found to have pancreatitis due to severe hypertriglyceridemia.   Significant events: 8/31>> epigastric pain- pancreatitis due to hypertriglyceridemia-admit to [facility].  Significant studies: 8/31>> CT abdomen/pelvis: Marked thickening of second/third portion of the duodenum with fat stranding/edema about the duodenum and head of the pancreas.  Significant microbiology data: None  Procedures: None  Consults: None  Subjective: Feels much better today.  Interviewed with iPad translator-significant improvement in epigastric pain.  No vomiting.  Objective: Vitals: Blood pressure 115/72, pulse (!) 115, temperature 99.1 F (37.3 C), temperature source Oral, resp. rate 16, height 5\' 3"  (1.6 m), weight 73.5 kg, SpO2 96 %, unknown if currently breastfeeding.   Exam: Gen Exam:Alert awake-not in any distress HEENT:atraumatic, normocephalic Chest: B/L clear to auscultation anteriorly CVS:S1S2 regular Abdomen soft-hardly any epigastric tenderness today. Extremities:no edema Neurology: Non focal Skin: no rash   Pertinent Labs/Radiology:    Latest Ref Rng & Units 01/09/2022    5:37 AM 01/08/2022    8:36 PM 01/07/2022   10:55 PM  CBC  WBC 4.0 - 10.5 K/uL 9.6  10.8    Hemoglobin 12.0 - 15.0 g/dL 01/09/2022  06.3  01.6   Hematocrit 36.0 - 46.0 % 33.9  37.5  44.0   Platelets 150 - 400 K/uL 188  194      Lab Results  Component Value Date   NA 135 01/09/2022   K 3.1 (L) 01/09/2022   CL 111 01/09/2022   CO2 20 (L) 01/09/2022      Assessment/Plan: Acute pancreatitis due to hypertriglyceridemia: Improved-minimal epigastric pain-start clear liquids.  Since  triglycerides significantly improved with n.p.o. status/IV insulin-stopping IV insulin infusion.  Continue supportive care.  Hypertriglyceridemia: Per history-she has been on fenofibrate since she was 31 years old-on alternative since she has been in the [place]-she has not taken fenofibrate for more than a year.  Significant improvement in triglyceridemia overnight-stopping insulin infusion-continue fenofibrate and Zetia.    DM-2: Stopping insulin infusion-starting SSI-follow.  Check A1c with a.m. labs.    Recent Labs    01/09/22 0533 01/09/22 0636 01/09/22 0749  GLUCAP 97 102* 97   Hypokalemia/hypomagnesemia/hypophosphatemia: Repleted-recheck tomorrow.  BMI: Estimated body mass index is 28.7 kg/m as calculated from the following:   Height as of this encounter: 5\' 3"  (1.6 m).   Weight as of this encounter: 73.5 kg.   Code status:   Code Status: Prior   DVT Prophylaxis: Lovenoxenoxaparin (LOVENOX) injection 40 mg Start: 01/08/22 1030   Family Communication: None at bedside   Disposition Plan: Status is: Inpatient Remains inpatient appropriate because: Acute pancreatitis due to hypertriglyceridemia-n.p.o./on IV insulin/IVF-and not stable for discharge.   Planned Discharge Destination:Home sometime next week.   Diet: Diet Order             Diet clear liquid Room service appropriate? Yes; Fluid consistency: Thin  Diet effective now                     Antimicrobial agents: Anti-infectives (From admission, onward)    None  MEDICATIONS: Scheduled Meds:  atorvastatin  80 mg Oral Daily   enoxaparin (LOVENOX) injection  40 mg Subcutaneous Q24H   ezetimibe  10 mg Oral Daily   fenofibrate  160 mg Oral Daily   insulin aspart  0-9 Units Subcutaneous TID WC   pantoprazole (PROTONIX) IV  40 mg Intravenous Q12H   polyethylene glycol  17 g Oral Daily   Continuous Infusions:  sodium chloride 125 mL/hr at 01/09/22 0941   magnesium sulfate bolus IVPB 4 g  (01/09/22 1007)   potassium PHOSPHATE IVPB (in mmol) 30 mmol (01/09/22 0943)   PRN Meds:.acetaminophen, bisacodyl, HYDROmorphone (DILAUDID) injection, senna-docusate, trimethobenzamide   I have personally reviewed following labs and imaging studies  LABORATORY DATA: CBC: Recent Labs  Lab 01/07/22 1501 01/07/22 2052 01/07/22 2255 01/08/22 2036 01/09/22 0537  WBC 12.8*  --   --  10.8* 9.6  HGB 15.2* 15.0 15.0 13.1 11.8*  HCT 44.0 44.0 44.0 37.5 33.9*  MCV 84.8  --   --  85.4 83.5  PLT 289  --   --  194 188     Basic Metabolic Panel: Recent Labs  Lab 01/07/22 1426 01/07/22 2052 01/07/22 2255 01/08/22 1630 01/08/22 2036 01/09/22 0537  NA 136 135 135 133* 135 135  K SPECIMEN HEMOLYZED. HEMOLYSIS MAY AFFECT INTEGRITY OF RESULTS. 5.0 4.3 3.7 3.1* 3.1*  CL 105 108  --  108 109 111  CO2 14*  --   --  18* 19* 20*  GLUCOSE 286* 274*  --  144* 110* 98  BUN 8 5*  --  7 7 <5*  CREATININE 0.51 0.20*  --  0.40* 0.47 0.34*  CALCIUM 8.8*  --   --  8.5* 8.3* 8.1*  MG  --   --   --   --  1.7 1.6*  PHOS  --   --   --   --   --  2.0*     GFR: Estimated Creatinine Clearance: 96.9 mL/min (A) (by C-G formula based on SCr of 0.34 mg/dL (L)).  Liver Function Tests: Recent Labs  Lab 01/07/22 1426 01/08/22 1630 01/09/22 0537  AST SPECIMEN HEMOLYZED. HEMOLYSIS MAY AFFECT INTEGRITY OF RESULTS. 23 13*  ALT SPECIMEN HEMOLYZED. HEMOLYSIS MAY AFFECT INTEGRITY OF RESULTS. 13 12  ALKPHOS SPECIMEN HEMOLYZED. HEMOLYSIS MAY AFFECT INTEGRITY OF RESULTS. 78 63  BILITOT SPECIMEN HEMOLYZED. HEMOLYSIS MAY AFFECT INTEGRITY OF RESULTS. 1.1 1.0  PROT SPECIMEN HEMOLYZED. HEMOLYSIS MAY AFFECT INTEGRITY OF RESULTS. 5.5* 5.2*  ALBUMIN 4.0 2.6* 2.3*    Recent Labs  Lab 01/09/22 0537  LIPASE 82*   No results for input(s): "AMMONIA" in the last 168 hours.  Coagulation Profile: No results for input(s): "INR", "PROTIME" in the last 168 hours.  Cardiac Enzymes: No results for input(s): "CKTOTAL",  "CKMB", "CKMBINDEX", "TROPONINI" in the last 168 hours.  BNP (last 3 results) No results for input(s): "PROBNP" in the last 8760 hours.  Lipid Profile: Recent Labs    01/08/22 0316 01/09/22 0537  CHOL 472*  --   HDL NOT REPORTED DUE TO HIGH TRIGLYCERIDES  --   LDLCALC NOT CALCULATED  --   TRIG 3,480* 263*  CHOLHDL NOT REPORTED DUE TO HIGH TRIGLYCERIDES  --   LDLDIRECT UNABLE TO CALCULATE IF TRIGLYCERIDE IS >1293 mg/dL  --      Thyroid Function Tests: No results for input(s): "TSH", "T4TOTAL", "FREET4", "T3FREE", "THYROIDAB" in the last 72 hours.  Anemia Panel: No results for input(s): "VITAMINB12", "FOLATE", "FERRITIN", "TIBC", "IRON", "RETICCTPCT" in the last  72 hours.  Urine analysis:    Component Value Date/Time   COLORURINE YELLOW 01/07/2022 1426   APPEARANCEUR CLEAR 01/07/2022 1426   LABSPEC 1.039 (H) 01/07/2022 1426   PHURINE 5.0 01/07/2022 1426   GLUCOSEU >=500 (A) 01/07/2022 1426   HGBUR NEGATIVE 01/07/2022 1426   BILIRUBINUR NEGATIVE 01/07/2022 1426   KETONESUR 80 (A) 01/07/2022 1426   PROTEINUR 100 (A) 01/07/2022 1426   NITRITE NEGATIVE 01/07/2022 1426   LEUKOCYTESUR NEGATIVE 01/07/2022 1426    Sepsis Labs: Lactic Acid, Venous    Component Value Date/Time   LATICACIDVEN 1.0 01/08/2022 2036    MICROBIOLOGY: No results found for this or any previous visit (from the past 240 hour(s)).  RADIOLOGY STUDIES/RESULTS: CT Abdomen Pelvis W Contrast  Result Date: 01/07/2022 CLINICAL DATA:  Abdominal pain. EXAM: CT ABDOMEN AND PELVIS WITH CONTRAST TECHNIQUE: Multidetector CT imaging of the abdomen and pelvis was performed using the standard protocol following bolus administration of intravenous contrast. RADIATION DOSE REDUCTION: This exam was performed according to the departmental dose-optimization program which includes automated exposure control, adjustment of the mA and/or kV according to patient size and/or use of iterative reconstruction technique. CONTRAST:   180mL OMNIPAQUE IOHEXOL 300 MG/ML  SOLN COMPARISON:  None Available. FINDINGS: Lower chest: No acute abnormality. Hepatobiliary: No focal liver abnormality is seen. No gallstones, gallbladder wall thickening, or biliary dilatation. Pancreas: There is edema about the head of the pancreas. Homogeneous perfusion of the pancreas without evidence of necrosis or pancreatic ductal dilatation. Spleen: Normal in size without focal abnormality. Adrenals/Urinary Tract: Adrenal glands are unremarkable. Kidneys are normal, without renal calculi, focal lesion, or hydronephrosis. Bladder is unremarkable. Stomach/Bowel: Stomach is within normal limits. There is marked thickening of the second, third part of the duodenum about the head of the pancreas with edema and inflammatory changes about the duodenum and head of the pancreas. Small bowel loops are normal in caliber. Appendix appears normal. No evidence of bowel wall thickening, distention, or inflammatory changes. Vascular/Lymphatic: No significant vascular findings are present. Portal venous system is patent. No enlarged abdominal or pelvic lymph nodes. Reproductive: Uterus and bilateral adnexa are unremarkable. Other: No abdominal wall hernia or abnormality. No abdominopelvic ascites. There is no extraluminal free air. Musculoskeletal: No acute or significant osseous findings. IMPRESSION: 1. Marked wall thickening of the second and third part of the duodenum with fat stranding and edema about the duodenal and head of the pancreas. The findings are likely secondary to severe duodenitis. Differential also includes pancreatitis with associated inflammation of the duodenum. Correlation with serum lipase/amylase would be helpful for further evaluation. 2. No extraluminal free air to suggest duodenal ulceration/perforation. 3.  Bowel loops are normal in caliber.  Normal appendix. 4.  No evidence of nephrolithiasis or hydronephrosis. 5.  Uterus and adnexa are unremarkable.  Electronically Signed   By: Keane Police D.O.   On: 01/07/2022 22:00     LOS: 1 day   Oren Binet, MD  [facility]    To contact the attending provider between 7A-7P or the covering provider during after hours 7P-7A, please log into the web site [email or web address] and access using universal [place] password for that web site. If you do not have the password, please call the hospital operator.  01/09/2022, 11:20 AM

## 2022-01-10 LAB — GLUCOSE, CAPILLARY
Glucose-Capillary: 170 mg/dL — ABNORMAL HIGH (ref 70–99)
Glucose-Capillary: 252 mg/dL — ABNORMAL HIGH (ref 70–99)
Glucose-Capillary: 152 mg/dL — ABNORMAL HIGH (ref 70–99)
Glucose-Capillary: 187 mg/dL — ABNORMAL HIGH (ref 70–99)

## 2022-01-10 LAB — CBC
HCT: 31.2 % — ABNORMAL LOW (ref 36.0–46.0)
Hemoglobin: 10.8 g/dL — ABNORMAL LOW (ref 12.0–15.0)
MCH: 29.6 pg (ref 26.0–34.0)
MCHC: 34.6 g/dL (ref 30.0–36.0)
MCV: 85.5 fL (ref 80.0–100.0)
Platelets: 168 10*3/uL (ref 150–400)
RBC: 3.65 MIL/uL — ABNORMAL LOW (ref 3.87–5.11)
RDW: 12.7 % (ref 11.5–15.5)
WBC: 9.2 10*3/uL (ref 4.0–10.5)
nRBC: 0 % (ref 0.0–0.2)

## 2022-01-10 LAB — COMPREHENSIVE METABOLIC PANEL
ALT: 11 U/L (ref 0–44)
AST: 10 U/L — ABNORMAL LOW (ref 15–41)
Albumin: 2.2 g/dL — ABNORMAL LOW (ref 3.5–5.0)
Alkaline Phosphatase: 66 U/L (ref 38–126)
Anion gap: 8 (ref 5–15)
BUN: <5 mg/dL — ABNORMAL LOW (ref 6–20)
CO2: 22 mmol/L (ref 22–32)
Calcium: 8.1 mg/dL — ABNORMAL LOW (ref 8.9–10.3)
Chloride: 106 mmol/L (ref 98–111)
Creatinine, Ser: 0.37 mg/dL — ABNORMAL LOW (ref 0.44–1.00)
GFR, Estimated: >60 mL/min (ref >60)
Glucose, Bld: 196 mg/dL — ABNORMAL HIGH (ref 70–99)
Potassium: 3 mmol/L — ABNORMAL LOW (ref 3.5–5.1)
Sodium: 136 mmol/L (ref 135–145)
Total Bilirubin: 1 mg/dL (ref 0.3–1.2)
Total Protein: 5.3 g/dL — ABNORMAL LOW (ref 6.5–8.1)

## 2022-01-10 LAB — MAGNESIUM: Magnesium: 1.6 mg/dL — ABNORMAL LOW (ref 1.7–2.4)

## 2022-01-10 LAB — HEMOGLOBIN A1C
Hgb A1c MFr Bld: 11.4 % — ABNORMAL HIGH (ref 4.8–5.6)
Mean Plasma Glucose: 280.48 mg/dL

## 2022-01-10 LAB — PHOSPHORUS: Phosphorus: 2.2 mg/dL — ABNORMAL LOW (ref 2.5–4.6)

## 2022-01-10 LAB — TRIGLYCERIDES: Triglycerides: 270 mg/dL — ABNORMAL HIGH (ref <150)

## 2022-01-10 MED ORDER — INSULIN ASPART PROT & ASPART (70-30 MIX) 100 UNIT/ML ~~LOC~~ SUSP
12.0000 [IU] | Freq: Two times a day (BID) | SUBCUTANEOUS | Status: DC
  Administered 2022-01-10 – 2022-01-11 (×3): 12 [IU] via SUBCUTANEOUS
  Filled 2022-01-10: qty 10

## 2022-01-10 MED ORDER — MAGNESIUM SULFATE 4 GM/100ML IV SOLN
4.0000 g | Freq: Once | INTRAVENOUS | Status: AC
Start: 2022-01-10 — End: 2022-01-10
  Administered 2022-01-10: 4 g via INTRAVENOUS
  Filled 2022-01-10: qty 100

## 2022-01-10 MED ORDER — INSULIN ASPART PROT & ASPART (70-30 MIX) 100 UNIT/ML ~~LOC~~ SUSP: 15.0000 [IU] | Freq: Two times a day (BID) | SUBCUTANEOUS | Status: DC

## 2022-01-10 MED ORDER — PANTOPRAZOLE SODIUM 40 MG PO TBEC
40.0000 mg | DELAYED_RELEASE_TABLET | Freq: Two times a day (BID) | ORAL | Status: DC
  Administered 2022-01-10 – 2022-01-12 (×4): 40 mg via ORAL
  Filled 2022-01-10 (×4): qty 1

## 2022-01-10 MED ORDER — POTASSIUM CHLORIDE CRYS ER 20 MEQ PO TBCR
40.0000 meq | EXTENDED_RELEASE_TABLET | Freq: Once | ORAL | Status: AC
Start: 2022-01-10 — End: 2022-01-10
  Administered 2022-01-10: 40 meq via ORAL
  Filled 2022-01-10: qty 2

## 2022-01-10 MED ORDER — DEXTROSE 5 % IV SOLN
30.0000 mmol | Freq: Once | INTRAVENOUS | Status: AC
  Administered 2022-01-10: 30 mmol via INTRAVENOUS
  Filled 2022-01-10: qty 10

## 2022-01-10 NOTE — Progress Notes (Signed)
  RD consulted for nutrition education regarding diabetes.   Lab Results  Component Value Date   HGBA1C 11.4 (H) 01/10/2022   Pt unavailable at time of visit. Attempted to speak with pt via call to hospital room phone, however, unable to reach. RD unable to obtain further nutrition-related history or complete nutrition-focused physical exam at this time.    RD provided "Carbohydrate Counting for People with Diabetes" handout from the Academy of Nutrition and Dietetics in preferred language of spanish. Attached to AVS/ discharge summary.   RD also referred to [facility]'s Nutrition and Diabetes Education Services for further support.   Current diet order is full liquid, patient is consuming approximately n/a% of meals at this time. Labs and medications reviewed. No further nutrition interventions warranted at this time. RD contact information provided. If additional nutrition issues arise, please re-consult RD.  Levada Schilling, RD, LDN, CDCES Registered Dietitian II Certified Diabetes Care and Education Specialist Please refer to [facility] for RD and/or RD on-call/weekend/after hours pager

## 2022-01-10 NOTE — Progress Notes (Signed)
PROGRESS NOTE        PATIENT DETAILS Name: Shelia Ford Age: 31 y.o. Sex: female Date of Birth: 08/12/1990 Admit Date: 01/07/2022 Admitting Physician Kristopher Oppenheim, DO [id number], Vickii Chafe, MD  Brief Summary: Patient is a 31 y.o.  female with history of DM-2, HLD-who presented with epigastric pain-found to have pancreatitis due to severe hypertriglyceridemia.   Significant events: 8/31>> epigastric pain- pancreatitis due to hypertriglyceridemia-admit to [facility].  Significant studies: 8/31>> CT abdomen/pelvis: Marked thickening of second/third portion of the duodenum with fat stranding/edema about the duodenum and head of the pancreas.  Significant microbiology data: None  Procedures: None  Consults: None  Subjective: Abdominal pain improving.  No vomiting.  Tolerating clear liquids.  Objective: Vitals: Blood pressure 105/66, pulse (!) 111, temperature 99.4 F (37.4 C), temperature source Oral, resp. rate 16, height 5\' 3"  (1.6 m), weight 73.5 kg, SpO2 95 %, unknown if currently breastfeeding.   Exam: Gen Exam:Alert awake-not in any distress HEENT:atraumatic, normocephalic Chest: B/L clear to auscultation anteriorly CVS:S1S2 regular Abdomen: Soft-minimal epigastric tenderness. Extremities:no edema Neurology: Non focal Skin: no rash   Pertinent Labs/Radiology:    Latest Ref Rng & Units 01/10/2022    3:48 AM 01/09/2022    5:37 AM 01/08/2022    8:36 PM  CBC  WBC 4.0 - 10.5 K/uL 9.2  9.6  10.8   Hemoglobin 12.0 - 15.0 g/dL 10.8  11.8  13.1   Hematocrit 36.0 - 46.0 % 31.2  33.9  37.5   Platelets 150 - 400 K/uL 168  188  194     Lab Results  Component Value Date   NA 136 01/10/2022   K 3.0 (L) 01/10/2022   CL 106 01/10/2022   CO2 22 01/10/2022      Assessment/Plan: Acute pancreatitis due to hypertriglyceridemia: Improving-tolerating clear liquids-advance to full liquids today.  No longer on insulin infusion since 9/2.  Continue  supportive care.  Hypertriglyceridemia: Per history-she has been on fenofibrate since she was 31 years old-on alternative since [place] the [place]-she has not taken fenofibrate for more than a year.  Initially on insulin infusion-this was discontinued on 9/2-she remains on Zetia and fenofibrate.   DM-2 (A1c 11.4 on 9/3) with uncontrolled hyperglycemia: Start insulin 70/30-12 units twice daily-as diet advances-we will slowly optimize regimen.  Resume metformin on discharge.  Begin diabetic education-insulin administration education.    Recent Labs    01/09/22 2138 01/10/22 0851 01/10/22 1154  GLUCAP 185* 170* 252*   Hypokalemia/hypomagnesemia/hypophosphatemia: Continue to replete-recheck tomorrow.  BMI: Estimated body mass index is 28.7 kg/m as calculated from the following:   Height as of this encounter: 5\' 3"  (1.6 m).   Weight as of this encounter: 73.5 kg.   Code status:   Code Status: Prior   DVT Prophylaxis: Lovenoxenoxaparin (LOVENOX) injection 40 mg Start: 01/08/22 1030   Family Communication: None at bedside   Disposition Plan: Status is: Inpatient Remains inpatient appropriate because: Resolving pancreatitis.  Not yet stable for discharge.   Planned Discharge Destination:Home 9/4-9/5 depending on clinical improvement.   Diet: Diet Order             Diet clear liquid Room service appropriate? Yes; Fluid consistency: Thin  Diet effective now                     Antimicrobial  agents: Anti-infectives (From admission, onward)    None        MEDICATIONS: Scheduled Meds:  atorvastatin  80 mg Oral Daily   enoxaparin (LOVENOX) injection  40 mg Subcutaneous Q24H   ezetimibe  10 mg Oral Daily   fenofibrate  160 mg Oral Daily   insulin aspart  0-9 Units Subcutaneous TID WC   insulin aspart protamine- aspart  12 Units Subcutaneous BID WC   pantoprazole (PROTONIX) IV  40 mg Intravenous Q12H   polyethylene glycol  17 g Oral Daily   Continuous  Infusions:  sodium chloride 125 mL/hr at 01/10/22 0331   potassium PHOSPHATE IVPB (in mmol) 30 mmol (01/10/22 1038)   PRN Meds:.acetaminophen, bisacodyl, HYDROmorphone (DILAUDID) injection, senna-docusate, trimethobenzamide   I have personally reviewed following labs and imaging studies  LABORATORY DATA: CBC: Recent Labs  Lab 01/07/22 1501 01/07/22 2052 01/07/22 2255 01/08/22 2036 01/09/22 0537 01/10/22 0348  WBC 12.8*  --   --  10.8* 9.6 9.2  HGB 15.2* 15.0 15.0 13.1 11.8* 10.8*  HCT 44.0 44.0 44.0 37.5 33.9* 31.2*  MCV 84.8  --   --  85.4 83.5 85.5  PLT 289  --   --  194 188 168     Basic Metabolic Panel: Recent Labs  Lab 01/07/22 1426 01/07/22 2052 01/07/22 2255 01/08/22 1630 01/08/22 2036 01/09/22 0537 01/10/22 0348  NA 136 135 135 133* 135 135 136  K SPECIMEN HEMOLYZED. HEMOLYSIS MAY AFFECT INTEGRITY OF RESULTS. 5.0 4.3 3.7 3.1* 3.1* 3.0*  CL 105 108  --  108 109 111 106  CO2 14*  --   --  18* 19* 20* 22  GLUCOSE 286* 274*  --  144* 110* 98 196*  BUN 8 5*  --  7 7 <5* <5*  CREATININE 0.51 0.20*  --  0.40* 0.47 0.34* 0.37*  CALCIUM 8.8*  --   --  8.5* 8.3* 8.1* 8.1*  MG  --   --   --   --  1.7 1.6* 1.6*  PHOS  --   --   --   --   --  2.0* 2.2*     GFR: Estimated Creatinine Clearance: 96.9 mL/min (A) (by C-G formula based on SCr of 0.37 mg/dL (L)).  Liver Function Tests: Recent Labs  Lab 01/07/22 1426 01/08/22 1630 01/09/22 0537 01/10/22 0348  AST SPECIMEN HEMOLYZED. HEMOLYSIS MAY AFFECT INTEGRITY OF RESULTS. 23 13* 10*  ALT SPECIMEN HEMOLYZED. HEMOLYSIS MAY AFFECT INTEGRITY OF RESULTS. 13 12 11   ALKPHOS SPECIMEN HEMOLYZED. HEMOLYSIS MAY AFFECT INTEGRITY OF RESULTS. 78 63 66  BILITOT SPECIMEN HEMOLYZED. HEMOLYSIS MAY AFFECT INTEGRITY OF RESULTS. 1.1 1.0 1.0  PROT SPECIMEN HEMOLYZED. HEMOLYSIS MAY AFFECT INTEGRITY OF RESULTS. 5.5* 5.2* 5.3*  ALBUMIN 4.0 2.6* 2.3* 2.2*    Recent Labs  Lab 01/09/22 0537  LIPASE 82*    No results for input(s):  "AMMONIA" in the last 168 hours.  Coagulation Profile: No results for input(s): "INR", "PROTIME" in the last 168 hours.  Cardiac Enzymes: No results for input(s): "CKTOTAL", "CKMB", "CKMBINDEX", "TROPONINI" in the last 168 hours.  BNP (last 3 results) No results for input(s): "PROBNP" in the last 8760 hours.  Lipid Profile: Recent Labs    01/08/22 0316 01/09/22 0537 01/10/22 0348  CHOL 472*  --   --   HDL NOT REPORTED DUE TO HIGH TRIGLYCERIDES  --   --   LDLCALC NOT CALCULATED  --   --   TRIG 3,480* 263* 270*  CHOLHDL NOT  REPORTED DUE TO HIGH TRIGLYCERIDES  --   --   LDLDIRECT UNABLE TO CALCULATE IF TRIGLYCERIDE IS >1293 mg/dL  --   --      Thyroid Function Tests: No results for input(s): "TSH", "T4TOTAL", "FREET4", "T3FREE", "THYROIDAB" in the last 72 hours.  Anemia Panel: No results for input(s): "VITAMINB12", "FOLATE", "FERRITIN", "TIBC", "IRON", "RETICCTPCT" in the last 72 hours.  Urine analysis:    Component Value Date/Time   COLORURINE YELLOW 01/07/2022 1426   APPEARANCEUR CLEAR 01/07/2022 1426   LABSPEC 1.039 (H) 01/07/2022 1426   PHURINE 5.0 01/07/2022 1426   GLUCOSEU >=500 (A) 01/07/2022 1426   HGBUR NEGATIVE 01/07/2022 1426   BILIRUBINUR NEGATIVE 01/07/2022 1426   KETONESUR 80 (A) 01/07/2022 1426   PROTEINUR 100 (A) 01/07/2022 1426   NITRITE NEGATIVE 01/07/2022 1426   LEUKOCYTESUR NEGATIVE 01/07/2022 1426    Sepsis Labs: Lactic Acid, Venous    Component Value Date/Time   LATICACIDVEN 1.0 01/08/2022 2036    MICROBIOLOGY: No results found for this or any previous visit (from the past 240 hour(s)).  RADIOLOGY STUDIES/RESULTS: No results found.   LOS: 2 days   Jeoffrey Massed, MD  [facility]    To contact the attending provider between 7A-7P or the covering provider during after hours 7P-7A, please log into the web site [email or web address] and access using universal [place] password for that web site. If you do not have the password,  please call the hospital operator.  01/10/2022, 2:00 PM

## 2022-01-10 NOTE — Discharge Instructions (Signed)
 Contar carbohidratos y la diabetes  Por qu es importante el conteo de carbohidratos?  ? Contar las porciones de carbohidratos ayuda a controlar el nivel de glucosa (azcar) en su sangre para que se sienta mejor.  ? El equilibrio entre los carbohidratos que come y la insulina determina el nivel de glucosa que tendr en la sangre despus de comer.  ? Contar carbohidratos tambin le ayudar a planificar sus comidas.   Qu alimentos contienen carbohidratos?  Entre los alimentos con carbohidratos se incluyen:  ? Panes, galletas saladas y cereales  ? Pastas, arroz y granos  ? Vegetales (verduras) con almidn, como papas, elote (maz o choclo) y chcharos (guisantes o arvejas)  ? Frijoles (habichuelas) y legumbres  ? Leche, leche de soya y yogur  ? Frutas y jugos de fruta  ? Dulces como pasteles, galletas, helados, mermeladas y jaleas   Porciones de carbohidratos  Al planificar comidas para la diabetes, recuerde que un alimento con 1 porcin de carbohidratos contiene aproximadamente 15 gramos de carbohidratos:  ? Revise el tamao de las porciones con tazas y cucharas de medir o con una pesa de alimentos.  ? Lea los Datos de Nutricin en las etiquetas de los alimentos para saber cuntos gramos de carbohidratos contienen los alimentos que come.   Los alimentos en las listas de este folleto muestran porciones que contienen cerca de 15 gramos de carbohidratos. Copyright Academy of Nutrition and Dietetics. This handout may be duplicated for client education. Carbohydrate Counting for Diabetes (Spanish) - 2   Consejos para planificar sus comidas  ? Un Plan de Alimentacin indica cuntas porciones de carbohidratos consumir en sus comidas y refrigerios (snacks). Para muchos adultos es adecuado comer 3 a 5 porciones de carbohidratos en cada comida y de 1 a 2 porciones de carbohidratos, en cada refrigerio.  ? En un Plan de Alimentacin diaria saludable, la mayora de los carbohidratos provienen de:  o  Al menos 6 porciones de frutas y vegetales sin almidn  o Al menos 6 porciones de granos, frijoles y vegetales con almidn, con al menos 3 de estas porciones de granos integrales (enteros)  o Al menos 2 porciones de leche o productos lcteos  ? Revise regularmente su nivel de glucosa en la sangre. Esto puede indicarle si necesita ajustar las horas a las que consume carbohidratos.  ? Comer alimentos que contienen fibra, como granos integrales, y comer muy pocos alimentos salados es bueno para su salud.  ? Coma 4 a 6 onzas de carne u otros alimentos con protenas (como hamburguesas de soya) cada da. Elija fuentes de protena bajas en grasa, como carne de res y de cerdo bajas en grasa, pollo, pescado, queso bajo en grasa o alimentos vegetarianos como la soya.  ? Coma algunas grasas saludables, como aceite de oliva, de canola y nueces.  ? Coma muy pocas grasas saturadas. Estas grasas no son saludables y se encuentran en la mantequilla, la crema y las carnes con mucha grasa, como el tocino (tocineta) y las salchichas o chorizos.  ? Coma muy pocas o nada de grasas trans. Estas grasas no son saludables y se encuentran en todos los alimentos que contienen aceites "parcialmente hidrogenados" en su lista de ingredientes.   Consejos para leer etiquetas  En los Datos de Nutricin de las etiquetas aparece una lista con el total de gramos de carbohidratos en una porcin estndar. La porcin estndar puede ser mayor o menor que 1 porcin de carbohidratos. Para saber cuntas porciones de carbohidratos hay   en un alimento:  ? Primero mire el tamao de la porcin estndar de la etiqueta.  ? Luego verifique el total de gramos de carbohidratos. Esta es la cantidad de carbohidratos en 1 porcin estndar. Divida el total de gramos de carbohidratos por 15. Este nmero equivale al nmero de porciones de carbohidratos en 1 porcin estndar. Recuerde: 1 porcin de carbohidratos equivale a 15 gramos de carbohidratos.  ? Nota:  Puede ignorar los gramos de azcar en los Datos de Nutricin, ya que estn incluidos en el total de gramos de carbohidratos.  Copyright Academy of Nutrition and Dietetics. This handout may be duplicated for client education. Carbohydrate Counting for Diabetes (Spanish) - 3   Listas de alimentos para el conteo de carbohidratos  1 porcin = cerca de 15 gramos de carbohidratos  Almidones  ? 1 rebanada de pan (1 onza)  ? 1 tortilla (6 pulgadas)  ?  rosca de pan (bagel) grande (1 onza)  ? 2 tortillas para taco (5 pulgadas)  ?  pan para hamburguesa o para salchicha (hot dog) (3/4 onza)  ?  taza de cereal listo para comer sin endulzar  ?  taza de cereal cocido  ? 1 taza de sopa a base de caldo  ? 4-6 galletitas saladas  ? ? taza de pasta o arroz (cocidos)  ?  taza de frijoles, chcharos, granos de elote, camotes (batatas, boniatos), calabaza (zapallo), pur de papas o papas hervidas (cocidos)  ?  papa grande asada (3 onzas)  ?  onza de pretzels, papitas o totopos (tortilla chips)  ? 3 tazas de palomitas de maz (popcorn) (ya preparadas)   Frutas  ? 1 fruta fresca pequea ( a 1 taza)  ?  taza de fruta enlatada o congelada  ? 17 uvas pequeas (3 onzas)  ? 1 taza de meln, bayas (moras)  ?  vaso de jugo de fruta  ? 2 cucharadas de frutas secas (arndanos azules/blueberries, cerezas, arndanos rojos/cranberries, frutas surtidas, uvas pasas/pasitas)  Leche  ? 1 taza de leche descremada o reducida en grasa  ? 1 taza de leche de soya  ? ? taza de yogur descremado endulzado con un edulcorante sin azcar (6 onzas)   Dulces y postres  ? pastel cuadrado de 2 pulgadas (sin betn/cobertura)  ? 2 galletitas dulces (? onzas)  ?  taza de helado o yogur congelado  ?  taza de sorbete (sherbet) o nieve (sorbet)  ? 1 cucharada de jarabe (sirope), mermelada, jalea, azcar o miel  ? 2 cucharadas de jarabe bajo en caloras  Copyright Academy of Nutrition and Dietetics. This handout may be  duplicated for client education. Carbohydrate Counting for Diabetes (Spanish) - 4   Otros alimentos  ? Cuente 1 taza de vegetales crudos o  taza de vegetales sin almidn, cocidas, como porciones de alimentos con cero (0) carbohidratos o "sin restriccin." Si come 3 o ms porciones en una comida, cuntelas como 1 porcin de carbohidratos.  ? Los alimentos que contienen menos de 20 caloras en cada porcin tambin pueden contarse como porciones con cero carbohidratos o alimentos "sin restriccin."  ? Cuente 1 taza de guiso (estofado) u otros alimentos combinados como 2 porciones de carbohidratos.   Notas: Copyright Academy of Nutrition and Dietetics. This handout may be duplicated for client education. Carbohydrate Counting for Diabetes (Spanish) - 5   Contar carbohidratos y la diabetes: Ejemplo de men para 1 da Desayuno  1 pltano/banana pequeo (1 carbohidrato)   taza de hojuelas de maz (cornflakes) (1   carbohidrato)  1 taza de leche descremada o baja en grasa (1 carbohidrato)  1 rebanada de pan de trigo integral (1 carbohidrato)  1 cucharadita de margarina   Almuerzo  2 onzas de rebanadas de pavo  2 rebanadas de pan de trigo integral (2 carbohidratos)  2 hojas de lechuga  4 palitos de apio  4 palitos de zanahoria  1 manzana mediana (1 carbohidrato)  1 taza de leche descremada o baja en grasa (1 carbohidrato)   Refrigerio  2 cucharadas de uvas pasas/pasitas (1 carbohidrato)   onzas de mini pretzels sin sal (1 carbohidrato)   Cena  3 onzas de carne asada de res, magra   papa grande asada (2 carbohidratos)  1 cucharada de crema agria reducida en grasa   taza de ejotes/habichuelas verdes/chauchas  1 taza de ensalada de vegetales  1 cucharada de aderezo para ensaladas reducido en caloras  1 panecillo de trigo integral (1 carbohidrato)  1 cucharadita de margarina  1 taza de bolitas de meln (1 carbohidrato)   Refrigerio  6 onzas de yogur de frutas bajo en grasa, sin azcar (1  carbohidrato)  2 cucharadas de nueces sin sal     

## 2022-01-11 LAB — BASIC METABOLIC PANEL
Anion gap: 8 (ref 5–15)
BUN: <5 mg/dL — ABNORMAL LOW (ref 6–20)
CO2: 22 mmol/L (ref 22–32)
Calcium: 8.6 mg/dL — ABNORMAL LOW (ref 8.9–10.3)
Chloride: 108 mmol/L (ref 98–111)
Creatinine, Ser: 0.34 mg/dL — ABNORMAL LOW (ref 0.44–1.00)
GFR, Estimated: >60 mL/min (ref >60)
Glucose, Bld: 224 mg/dL — ABNORMAL HIGH (ref 70–99)
Potassium: 3.1 mmol/L — ABNORMAL LOW (ref 3.5–5.1)
Sodium: 138 mmol/L (ref 135–145)

## 2022-01-11 LAB — GLUCOSE, CAPILLARY
Glucose-Capillary: 189 mg/dL — ABNORMAL HIGH (ref 70–99)
Glucose-Capillary: 195 mg/dL — ABNORMAL HIGH (ref 70–99)
Glucose-Capillary: 205 mg/dL — ABNORMAL HIGH (ref 70–99)
Glucose-Capillary: 226 mg/dL — ABNORMAL HIGH (ref 70–99)

## 2022-01-11 LAB — MAGNESIUM: Magnesium: 1.8 mg/dL (ref 1.7–2.4)

## 2022-01-11 LAB — PHOSPHORUS: Phosphorus: 2.9 mg/dL (ref 2.5–4.6)

## 2022-01-11 MED ORDER — INSULIN ASPART PROT & ASPART (70-30 MIX) 100 UNIT/ML ~~LOC~~ SUSP
16.0000 [IU] | Freq: Two times a day (BID) | SUBCUTANEOUS | Status: DC
Start: 2022-01-11 — End: 2022-01-12
  Administered 2022-01-11: 16 [IU] via SUBCUTANEOUS
  Filled 2022-01-11: qty 10

## 2022-01-11 MED ORDER — POTASSIUM CHLORIDE CRYS ER 20 MEQ PO TBCR
40.0000 meq | EXTENDED_RELEASE_TABLET | ORAL | Status: AC
  Administered 2022-01-11 (×2): 40 meq via ORAL
  Filled 2022-01-11 (×2): qty 2

## 2022-01-11 MED ORDER — OXYCODONE HCL 5 MG PO TABS
5.0000 mg | ORAL_TABLET | Freq: Four times a day (QID) | ORAL | Status: DC | PRN
  Administered 2022-01-11: 5 mg via ORAL
  Filled 2022-01-11: qty 1

## 2022-01-11 MED ORDER — MAGNESIUM SULFATE 2 GM/50ML IV SOLN
2.0000 g | Freq: Once | INTRAVENOUS | Status: AC
Start: 2022-01-11 — End: 2022-01-11
  Administered 2022-01-11: 2 g via INTRAVENOUS
  Filled 2022-01-11: qty 50

## 2022-01-11 NOTE — Inpatient Diabetes Management (Signed)
Inpatient Diabetes Program Recommendations  AACE/ADA: New Consensus Statement on Inpatient Glycemic Control (2015)  Target Ranges:  Prepandial:   less than 140 mg/dL      Peak postprandial:   less than 180 mg/dL (1-2 hours)      Critically ill patients:  140 - 180 mg/dL    Latest Reference Range & Units 01/10/22 08:51 01/10/22 11:54 01/10/22 16:30 01/10/22 21:18  Glucose-Capillary 70 - 99 mg/dL 170 (H)  2 units Novolog  12 units 70/30 Insulin  252 (H)  5 units Novolog  152 (H)  2 units Novolog  12 units 70/30 Insulin  187 (H)  (H): Data is abnormally high  Latest Reference Range & Units 01/11/22 07:32 01/11/22 11:11  Glucose-Capillary 70 - 99 mg/dL 189 (H)  2 units Novolog  12 units 70/30 Insulin '@0855'  195 (H)     Admit with:  Acute pancreatitis due to hypertriglyceridemia Hyperglycemia  History: DM2  Home DM Meds: Metformin 500 mg BID  Current Orders: 70/30 Insulin 12 units BID      Novolog Sensitive Correction Scale/ SSI (0-9 units) TID AC     D/C home with Metformin + 70/30 Insulin BID  MD- Note CBGs 189-195 today.  Please consider slight increase of 70/30 Insulin to 14 units BID.  Please have pt get her insulin at [facility].  70/30 Insulin pens (5 pack) is $48 and Insulin pen needles 100 count are $13.  [facility] 70/30 Insulin Pens- order # [id number]  [facility] Insulin Pen Needles- order # [id number]   Addendum 12pm: Met w/ pt at bedside to discuss Current A1c, d/c home plans of Metformin + Insulin 70/30 BID.  Brother at bedside along with other family members.  Brother spoke [place] well and volunteered to help me teach pt insulin injections.    Spoke with patient about her current A1c of 11.4%.  Explained what an A1c is and what it measures.  Reminded patient that her goal A1c is 7% or less per ADA standards to prevent both acute and long-term complications.  Reviewed goal CBGs for home.  Pt stated she has CBG meter at home.  Explained to patient the extreme  importance of good glucose control at home.  Also explained that with her history of pancreatitis, high TG levels, and current elevated A1c that adding insulin is likely the best course of action.  Pt told me the MD mentioned sending her home on insulin.  Explained what 70/30 Insulin is, how it works, when to take, and also explained to pt that she can purchase the 70/30 Insulin at [employer] (without a Rx) for $43 for 5 pens (along with pen needles).  Encouraged patient to check her CBGs at least bid at home (before taking insulin with bfast and dinner) and to record all CBGs in a logbook for her PCP to review.  Educated patient on insulin pen use at home.  Reviewed all steps of insulin pen including attachment of needle, 2-unit air shot, dialing up dose, giving injection, rotation of injection sites, removing needle, disposal of sharps, storage of unused insulin, disposal of insulin etc.  Patient able to provide successful return demonstration.  Reviewed troubleshooting with insulin pen.  Also reviewed Signs/Symptoms of Hypoglycemia with patient and how to treat Hypoglycemia at home.  Have asked RNs caring for patient to please allow patient to give all injections here in hospital as much as possible for practice.  MD to give patient Rxs for insulin pens and insulin pen needles.   Pt  educated that she can purchase 70/30 Insulin at [facility] for low cost.    --Will follow patient during hospitalization--  Wyn Quaker RN, MSN, [place] [facility] Coordinator Inpatient Glycemic Control Team Team Pager: [phone] (8a-5p)

## 2022-01-11 NOTE — TOC Initial Note (Signed)
Transition of Care [facility]) - Initial/Assessment Note    Patient Details  Name: Shelia Ford MRN: [id number] Date of Birth: [birth year]  Transition of Care [facility] CM/SW Contact:    Harriet Masson, RN Phone Number: 01/11/2022, 2:35 PM  Clinical Narrative:                 Spoke to patient at bedside using interpreter. Patient agreeable for TOC to make her a PCP apt. This RNCM sent request to CMA to schedule apt tomorrow due to holiday. This RNCM explained the [facility] letter and patient states she can afford the copay. Patient usually drives and has family to help with transportation if needed.  TOC will continue to follow for needs. Expected Discharge Plan: Home/Self Care Barriers to Discharge: Continued Medical Work up   Patient Goals and CMS Choice Patient states their goals for this hospitalization and ongoing recovery are:: return home      Expected Discharge Plan and Services Expected Discharge Plan: Home/Self Care   Discharge Planning Services: MATCH Program, Follow-up appt scheduled   Living arrangements for the past 2 months: Single Family Home                                      Prior Living Arrangements/Services Living arrangements for the past 2 months: Single Family Home   Patient language and need for interpreter reviewed:: Yes        Need for Family Participation in Patient Care: Yes (Comment) Care giver support system in place?: Yes (comment)   Criminal Activity/Legal Involvement Pertinent to Current Situation/Hospitalization: No - Comment as needed  Activities of Daily Living Home Assistive Devices/Equipment: None ADL Screening (condition at time of admission) Patient's cognitive ability adequate to safely complete daily activities?: Yes Is the patient deaf or have difficulty hearing?: No Does the patient have difficulty seeing, even when wearing glasses/contacts?: No Does the patient have difficulty concentrating, remembering, or  making decisions?: No Patient able to express need for assistance with ADLs?: Yes Does the patient have difficulty dressing or bathing?: No Independently performs ADLs?: Yes (appropriate for developmental age) Does the patient have difficulty walking or climbing stairs?: No Weakness of Arms/Hands: None  Permission Sought/Granted                  Emotional Assessment Appearance:: Appears stated age Attitude/Demeanor/Rapport: Engaged Affect (typically observed): Accepting Orientation: : Oriented to Self, Oriented to Place, Oriented to  Time, Oriented to Situation Alcohol / Substance Use: Not Applicable Psych Involvement: No (comment)  Admission diagnosis:  Acute pancreatitis [K85.90] Hyperglycemia [R73.9] Acute pancreatitis, unspecified complication status, unspecified pancreatitis type [K85.90] Patient Active Problem List   Diagnosis Date Noted   Diabetes mellitus without complication (HCC) 01/08/2022   Hypertriglyceridemia 01/08/2022   Acute pancreatitis 01/08/2022   PCP:  Catalina Antigua, MD Pharmacy:   [facility] [address] [place] - [address] [place] [address] Phone: [phone] Fax: [phone]     Social Determinants of Health (SDOH) Interventions    Readmission Risk Interventions     No data to display

## 2022-01-11 NOTE — Progress Notes (Signed)
PROGRESS NOTE        PATIENT DETAILS Name: Shelia Ford Age: 31 y.o. Sex: female Date of Birth: 13-Aug-1990 Admit Date: 01/07/2022 Admitting Physician Carollee Herter, DO RDE:[id number], Gigi Gin, MD  Brief Summary: Patient is a 31 y.o.  female with history of DM-2, HLD-who presented with epigastric pain-found to have pancreatitis due to severe hypertriglyceridemia.   Significant events: 8/31>> epigastric pain- pancreatitis due to hypertriglyceridemia-admit to [facility].  Significant studies: 8/31>> CT abdomen/pelvis: Marked thickening of second/third portion of the duodenum with fat stranding/edema about the duodenum and head of the pancreas.  Significant microbiology data: None  Procedures: None  Consults: None  Subjective: Continues to improve-some abdominal pain but appears comfortable.  Objective: Vitals: Blood pressure 108/86, pulse 95, temperature 98.1 F (36.7 C), temperature source Oral, resp. rate 18, height 5\' 3"  (1.6 m), weight 73.5 kg, SpO2 98 %, unknown if currently breastfeeding.   Exam: Gen Exam:Alert awake-not in any distress HEENT:atraumatic, normocephalic Chest: B/L clear to auscultation anteriorly CVS:S1S2 regular Abdomen:soft non tender, non distended Extremities:no edema Neurology: Non focal Skin: no rash   Pertinent Labs/Radiology:    Latest Ref Rng & Units 01/10/2022    3:48 AM 01/09/2022    5:37 AM 01/08/2022    8:36 PM  CBC  WBC 4.0 - 10.5 K/uL 9.2  9.6  10.8   Hemoglobin 12.0 - 15.0 g/dL 03/10/2022  56.3  14.9   Hematocrit 36.0 - 46.0 % 31.2  33.9  37.5   Platelets 150 - 400 K/uL 168  188  194     Lab Results  Component Value Date   NA 138 01/11/2022   K 3.1 (L) 01/11/2022   CL 108 01/11/2022   CO2 22 01/11/2022      Assessment/Plan: Acute pancreatitis due to hypertriglyceridemia: Better-advance to soft diet-continue supportive care.  Stop IVF.   Hypertriglyceridemia: Per history-she has been on fenofibrate  since she was 31 years old-she has not taken fenofibrate for more than a year.  Initially on insulin infusion-this was discontinued on 9/2-she remains on Zetia and fenofibrate.   DM-2 (A1c 11.4 on 9/3) with uncontrolled hyperglycemia: CBGs slowly creeping up as diet is being advanced-increase insulin 70/30 to 16 units twice daily.  Plan is to resume metformin on discharge.  Diabetic teaching ongoing.    Recent Labs    01/10/22 2118 01/11/22 0732 01/11/22 1111  GLUCAP 187* 189* 195*   Hypokalemia/hypomagnesemia/hypophosphatemia: Continue to replete-recheck tomorrow.  BMI: Estimated body mass index is 28.7 kg/m as calculated from the following:   Height as of this encounter: 5\' 3"  (1.6 m).   Weight as of this encounter: 73.5 kg.   Code status:   Code Status: Prior   DVT Prophylaxis: Lovenoxenoxaparin (LOVENOX) injection 40 mg Start: 01/08/22 1030   Family Communication: None at bedside   Disposition Plan: Status is: Inpatient Remains inpatient appropriate because: Resolving pancreatitis.  Not yet stable for discharge.   Planned Discharge Destination:Home 9/5   Diet: Diet Order             DIET SOFT Room service appropriate? Yes; Fluid consistency: Thin  Diet effective now                     Antimicrobial agents: Anti-infectives (From admission, onward)    None        MEDICATIONS: Scheduled Meds:  atorvastatin  80 mg Oral Daily   enoxaparin (LOVENOX) injection  40 mg Subcutaneous Q24H   ezetimibe  10 mg Oral Daily   fenofibrate  160 mg Oral Daily   insulin aspart  0-9 Units Subcutaneous TID WC   insulin aspart protamine- aspart  12 Units Subcutaneous BID WC   pantoprazole  40 mg Oral BID   polyethylene glycol  17 g Oral Daily   Continuous Infusions:  sodium chloride 75 mL/hr at 01/10/22 1506   PRN Meds:.acetaminophen, bisacodyl, HYDROmorphone (DILAUDID) injection, oxyCODONE, senna-docusate, trimethobenzamide   I have personally reviewed  following labs and imaging studies  LABORATORY DATA: CBC: Recent Labs  Lab 01/07/22 1501 01/07/22 2052 01/07/22 2255 01/08/22 2036 01/09/22 0537 01/10/22 0348  WBC 12.8*  --   --  10.8* 9.6 9.2  HGB 15.2* 15.0 15.0 13.1 11.8* 10.8*  HCT 44.0 44.0 44.0 37.5 33.9* 31.2*  MCV 84.8  --   --  85.4 83.5 85.5  PLT 289  --   --  194 188 168     Basic Metabolic Panel: Recent Labs  Lab 01/08/22 1630 01/08/22 2036 01/09/22 0537 01/10/22 0348 01/11/22 0327  NA 133* 135 135 136 138  K 3.7 3.1* 3.1* 3.0* 3.1*  CL 108 109 111 106 108  CO2 18* 19* 20* 22 22  GLUCOSE 144* 110* 98 196* 224*  BUN 7 7 <5* <5* <5*  CREATININE 0.40* 0.47 0.34* 0.37* 0.34*  CALCIUM 8.5* 8.3* 8.1* 8.1* 8.6*  MG  --  1.7 1.6* 1.6* 1.8  PHOS  --   --  2.0* 2.2* 2.9     GFR: Estimated Creatinine Clearance: 96.9 mL/min (A) (by C-G formula based on SCr of 0.34 mg/dL (L)).  Liver Function Tests: Recent Labs  Lab 01/07/22 1426 01/08/22 1630 01/09/22 0537 01/10/22 0348  AST SPECIMEN HEMOLYZED. HEMOLYSIS MAY AFFECT INTEGRITY OF RESULTS. 23 13* 10*  ALT SPECIMEN HEMOLYZED. HEMOLYSIS MAY AFFECT INTEGRITY OF RESULTS. 13 12 11   ALKPHOS SPECIMEN HEMOLYZED. HEMOLYSIS MAY AFFECT INTEGRITY OF RESULTS. 78 63 66  BILITOT SPECIMEN HEMOLYZED. HEMOLYSIS MAY AFFECT INTEGRITY OF RESULTS. 1.1 1.0 1.0  PROT SPECIMEN HEMOLYZED. HEMOLYSIS MAY AFFECT INTEGRITY OF RESULTS. 5.5* 5.2* 5.3*  ALBUMIN 4.0 2.6* 2.3* 2.2*    Recent Labs  Lab 01/09/22 0537  LIPASE 82*    No results for input(s): "AMMONIA" in the last 168 hours.  Coagulation Profile: No results for input(s): "INR", "PROTIME" in the last 168 hours.  Cardiac Enzymes: No results for input(s): "CKTOTAL", "CKMB", "CKMBINDEX", "TROPONINI" in the last 168 hours.  BNP (last 3 results) No results for input(s): "PROBNP" in the last 8760 hours.  Lipid Profile: Recent Labs    01/09/22 0537 01/10/22 0348  TRIG 263* 270*     Thyroid Function Tests: No  results for input(s): "TSH", "T4TOTAL", "FREET4", "T3FREE", "THYROIDAB" in the last 72 hours.  Anemia Panel: No results for input(s): "VITAMINB12", "FOLATE", "FERRITIN", "TIBC", "IRON", "RETICCTPCT" in the last 72 hours.  Urine analysis:    Component Value Date/Time   COLORURINE YELLOW 01/07/2022 1426   APPEARANCEUR CLEAR 01/07/2022 1426   LABSPEC 1.039 (H) 01/07/2022 1426   PHURINE 5.0 01/07/2022 1426   GLUCOSEU >=500 (A) 01/07/2022 1426   HGBUR NEGATIVE 01/07/2022 1426   BILIRUBINUR NEGATIVE 01/07/2022 1426   KETONESUR 80 (A) 01/07/2022 1426   PROTEINUR 100 (A) 01/07/2022 1426   NITRITE NEGATIVE 01/07/2022 1426   LEUKOCYTESUR NEGATIVE 01/07/2022 1426    Sepsis Labs: Lactic Acid, Venous    Component  Value Date/Time   LATICACIDVEN 1.0 01/08/2022 2036    MICROBIOLOGY: No results found for this or any previous visit (from the past 240 hour(s)).  RADIOLOGY STUDIES/RESULTS: No results found.   LOS: 3 days   Jeoffrey Massed, MD  [facility]    To contact the attending provider between 7A-7P or the covering provider during after hours 7P-7A, please log into the web site [email or web address] and access using universal [place] password for that web site. If you do not have the password, please call the hospital operator.  01/11/2022, 1:24 PM

## 2022-01-12 ENCOUNTER — Other Ambulatory Visit (HOSPITAL_COMMUNITY): Payer: Self-pay

## 2022-01-12 LAB — BASIC METABOLIC PANEL
Anion gap: 10 (ref 5–15)
BUN: 7 mg/dL (ref 6–20)
CO2: 22 mmol/L (ref 22–32)
Calcium: 9.1 mg/dL (ref 8.9–10.3)
Chloride: 105 mmol/L (ref 98–111)
Creatinine, Ser: 0.47 mg/dL (ref 0.44–1.00)
GFR, Estimated: >60 mL/min (ref >60)
Glucose, Bld: 243 mg/dL — ABNORMAL HIGH (ref 70–99)
Potassium: 3.6 mmol/L (ref 3.5–5.1)
Sodium: 137 mmol/L (ref 135–145)

## 2022-01-12 LAB — MAGNESIUM: Magnesium: 1.8 mg/dL (ref 1.7–2.4)

## 2022-01-12 LAB — GLUCOSE, CAPILLARY
Glucose-Capillary: 224 mg/dL — ABNORMAL HIGH (ref 70–99)
Glucose-Capillary: 337 mg/dL — ABNORMAL HIGH (ref 70–99)

## 2022-01-12 MED ORDER — EZETIMIBE 10 MG PO TABS
10.0000 mg | ORAL_TABLET | Freq: Every day | ORAL | 3 refills | Status: DC
  Filled 2022-01-12: qty 30, 30d supply, fill #0

## 2022-01-12 MED ORDER — INSULIN PEN NEEDLE 32G X 4 MM MISC
1.0000 | 0 refills | Status: DC | PRN
Start: 2022-01-12 — End: 2023-11-01

## 2022-01-12 MED ORDER — INSULIN PEN NEEDLE 32G X 4 MM MISC
1.0000 | 0 refills | Status: DC | PRN
Start: 2022-01-12 — End: 2023-11-01
  Filled 2022-01-12: qty 100, 25d supply, fill #0

## 2022-01-12 MED ORDER — INSULIN ASPART PROT & ASPART (70-30 MIX) 100 UNIT/ML PEN
20.0000 [IU] | PEN_INJECTOR | Freq: Two times a day (BID) | SUBCUTANEOUS | 0 refills | Status: DC
  Filled 2022-01-12: qty 12, 30d supply, fill #0

## 2022-01-12 MED ORDER — FENOFIBRATE 160 MG PO TABS
160.0000 mg | ORAL_TABLET | Freq: Every day | ORAL | 3 refills | Status: DC
  Filled 2022-01-12: qty 30, 30d supply, fill #0

## 2022-01-12 MED ORDER — FINGERSTIX LANCETS MISC
0 refills | Status: DC
  Filled 2022-01-12: qty 100, 25d supply, fill #0

## 2022-01-12 MED ORDER — NOVOLIN 70/30 FLEXPEN RELION (70-30) 100 UNIT/ML ~~LOC~~ SUPN: 20.0000 [IU] | PEN_INJECTOR | Freq: Two times a day (BID) | SUBCUTANEOUS | 11 refills | Status: DC

## 2022-01-12 MED ORDER — GLUCOSE BLOOD VI STRP
ORAL_STRIP | 0 refills | Status: DC
Start: 2022-01-12 — End: 2023-11-01
  Filled 2022-01-12: qty 100, 25d supply, fill #0

## 2022-01-12 MED ORDER — METFORMIN HCL 500 MG PO TABS
500.0000 mg | ORAL_TABLET | Freq: Two times a day (BID) | ORAL | 11 refills | Status: DC
  Filled 2022-01-12: qty 60, 30d supply, fill #0

## 2022-01-12 MED ORDER — BLOOD GLUCOSE METER KIT
PACK | 0 refills | Status: DC
  Filled 2022-01-12: qty 1, 30d supply, fill #0

## 2022-01-12 MED ORDER — INSULIN ASPART PROT & ASPART (70-30 MIX) 100 UNIT/ML ~~LOC~~ SUSP
20.0000 [IU] | Freq: Two times a day (BID) | SUBCUTANEOUS | Status: DC
Start: 2022-01-12 — End: 2022-01-12
  Administered 2022-01-12: 20 [IU] via SUBCUTANEOUS
  Filled 2022-01-12: qty 10

## 2022-01-12 NOTE — Discharge Summary (Signed)
PATIENT DETAILS Name: Shelia Ford Age: 31 y.o. Sex: female Date of Birth: [birth year] MRN: [id number]. Admitting Physician: Kristopher Oppenheim, DO EVO:[id number], Vickii Chafe, MD  Admit Date: 01/07/2022 Discharge date: 01/12/2022  Recommendations for Outpatient Follow-up:  Follow up with PCP in 1-2 weeks Please obtain CMP/CBC in one week  Admitted From:  Home  Disposition: Home   Discharge Condition: good  CODE STATUS:   Code Status: Prior   Diet recommendation:  Diet Order             Diet Carb Modified           DIET SOFT Room service appropriate? Yes; Fluid consistency: Thin  Diet effective now                    Brief Summary: Patient is a 31 y.o.  female with history of DM-2, HLD-who presented with epigastric pain-found to have pancreatitis due to severe hypertriglyceridemia.     Significant events: 8/31>> epigastric pain- pancreatitis due to hypertriglyceridemia-admit to [facility].   Significant studies: 8/31>> CT abdomen/pelvis: Marked thickening of second/third portion of the duodenum with fat stranding/edema about the duodenum and head of the pancreas.   Significant microbiology data: None   Procedures: None   Consults: None  Brief Hospital Course: Acute pancreatitis due to hypertriglyceridemia: Significant clinical improvement-with supportive care-initially required IV insulin/IV fluids-diet slowly advanced.  Tolerating soft diet without any issues.     Hypertriglyceridemia: Per history-she has been on fenofibrate since she was 31 years old-she has not taken fenofibrate for more than a year.  Initially on insulin infusion-this was discontinued on 9/2-she remains on Zetia and fenofibrate.  She will need close monitoring in the outpatient setting.   DM-2 (A1c 11.4 on 9/3) with uncontrolled hyperglycemia: CBGs slowly creeping up as diet being advanced-plan is to increase insulin 70/30 to 20 units twice daily-and resume metformin on discharge.  Extensive  diabetic education-insulin administration education provided during this hospitalization.  She will follow-up with her primary care practitioner for further optimization.    Note-30-day supply of medication sent to [facility] pharmacy-management  will try to give her these medications through the match program.  Subsequent supplies/refills sent to [facility] pharmacy-patient is aware that she is to start taking  the [facility] insulin after she finishes the supply that she received from the hospital.    Hypokalemia/hypomagnesemia/hypophosphatemia: Continue to replete-recheck tomorrow.   BMI: Estimated body mass index is 28.7 kg/m as calculated from the following:   Height as of this encounter: '5\' 3"'  (1.6 m).   Weight as of this encounter: 73.5 kg.    Discharge Diagnoses:  Principal Problem:   Acute pancreatitis Active Problems:   Diabetes mellitus without complication ([place])   Hypertriglyceridemia   Discharge Instructions:  Activity:  As tolerated   Discharge Instructions     Amb Referral to Nutrition and Diabetic Education   Complete by: As directed    Diet Carb Modified   Complete by: As directed    Discharge instructions   Complete by: As directed    Follow with Primary MD  Constant, Peggy, MD in 1-2 weeks  Please check your CBGs multiple times a day-keep a record of your readings and take it to your primary care practitioner at your next appointment.  Please get a complete blood count and chemistry panel checked by your Primary MD at your next visit, and again as instructed by your Primary MD.  Get Medicines reviewed and adjusted: Please take all your  medications with you for your next visit with your Primary MD  Laboratory/radiological data: Please request your Primary MD to go over all hospital tests and procedure/radiological results at the follow up, please ask your Primary MD to get all Hospital records sent to his/her office.  In some cases, they will be blood work, cultures  and biopsy results pending at the time of your discharge. Please request that your primary care M.D. follows up on these results.  Also Note the following: If you experience worsening of your admission symptoms, develop shortness of breath, life threatening emergency, suicidal or homicidal thoughts you must seek medical attention immediately by calling 911 or calling your MD immediately  if symptoms less severe.  You must read complete instructions/literature along with all the possible adverse reactions/side effects for all the Medicines you take and that have been prescribed to you. Take any new Medicines after you have completely understood and accpet all the possible adverse reactions/side effects.   Do not drive when taking Pain medications or sleeping medications (Benzodaizepines)  Do not take more than prescribed Pain, Sleep and Anxiety Medications. It is not advisable to combine anxiety,sleep and pain medications without talking with your primary care practitioner  Special Instructions: If you have smoked or chewed Tobacco  in the last 2 yrs please stop smoking, stop any regular Alcohol  and or any Recreational drug use.  Wear Seat belts while driving.  Please note: You were cared for by a hospitalist during your hospital stay. Once you are discharged, your primary care physician will handle any further medical issues. Please note that NO REFILLS for any discharge medications will be authorized once you are discharged, as it is imperative that you return to your primary care physician (or establish a relationship with a primary care physician if you do not have one) for your post hospital discharge needs so that they can reassess your need for medications and monitor your lab values.   Increase activity slowly   Complete by: As directed       Allergies as of 01/12/2022   No Known Allergies      Medication List     TAKE these medications    acetaminophen 650 MG CR  tablet Commonly known as: Tylenol 8 Hour Take 1 tablet (650 mg total) by mouth every 8 (eight) hours as needed for pain.   blood glucose meter kit and supplies Dispense based on patient and insurance preference. Use up to four times daily as directed. (FOR ICD-10 E10.9, E11.9).   ezetimibe 10 MG tablet Commonly known as: ZETIA Take 1 tablet (10 mg total) by mouth daily. Start taking on: January 13, 2022   fenofibrate 160 MG tablet Take 1 tablet (160 mg total) by mouth daily. Start taking on: January 13, 2022   insulin aspart protamine - aspart (70-30) 100 UNIT/ML FlexPen Commonly known as: NOVOLOG 70/30 MIX Inject 20 Units into the skin 2 (two) times daily with a meal.   Insulin Pen Needle 32G X 4 MM Misc 1 each by Does not apply route as needed.   Insulin Pen Needle 32G X 4 MM Misc 1 each by Does not apply route as needed.   metFORMIN 500 MG tablet Commonly known as: GLUCOPHAGE Take 1 tablet (500 mg total) by mouth 2 (two) times daily with a meal. What changed: how much to take   multivitamin with minerals Tabs tablet Take 1 tablet by mouth daily.   NovoLIN 70/30 Kwikpen (70-30) 100 UNIT/ML KwikPen  Generic drug: insulin isophane & regular human KwikPen Inject 20 Units into the skin 2 (two) times daily. These are your refills-please start taking this medication only after you have finished the insulin that you got from the hospital.DO NOT TAKE THIS INSULIN UNLESS [place] Start taking on: February 11, 2022        Follow-up Information     Constant, Peggy, MD. Schedule an appointment as soon as possible for a visit in 1 week(s).   Specialty: Obstetrics and Gynecology Contact information: [place] [address] [phone]                No Known Allergies   Other Procedures/Studies: CT Abdomen Pelvis W Contrast  Result Date: 01/07/2022 CLINICAL DATA:  Abdominal pain. EXAM:  CT ABDOMEN AND PELVIS WITH CONTRAST TECHNIQUE: Multidetector CT imaging of the abdomen and pelvis was performed using the standard protocol following bolus administration of intravenous contrast. RADIATION DOSE REDUCTION: This exam was performed according to the departmental dose-optimization program which includes automated exposure control, adjustment of the mA and/or kV according to patient size and/or use of iterative reconstruction technique. CONTRAST:  12m OMNIPAQUE IOHEXOL 300 MG/ML  SOLN COMPARISON:  None Available. FINDINGS: Lower chest: No acute abnormality. Hepatobiliary: No focal liver abnormality is seen. No gallstones, gallbladder wall thickening, or biliary dilatation. Pancreas: There is edema about the head of the pancreas. Homogeneous perfusion of the pancreas without evidence of necrosis or pancreatic ductal dilatation. Spleen: Normal in size without focal abnormality. Adrenals/Urinary Tract: Adrenal glands are unremarkable. Kidneys are normal, without renal calculi, focal lesion, or hydronephrosis. Bladder is unremarkable. Stomach/Bowel: Stomach is within normal limits. There is marked thickening of the second, third part of the duodenum about the head of the pancreas with edema and inflammatory changes about the duodenum and head of the pancreas. Small bowel loops are normal in caliber. Appendix appears normal. No evidence of bowel wall thickening, distention, or inflammatory changes. Vascular/Lymphatic: No significant vascular findings are present. Portal venous system is patent. No enlarged abdominal or pelvic lymph nodes. Reproductive: Uterus and bilateral adnexa are unremarkable. Other: No abdominal wall hernia or abnormality. No abdominopelvic ascites. There is no extraluminal free air. Musculoskeletal: No acute or significant osseous findings. IMPRESSION: 1. Marked wall thickening of the second and third part of the duodenum with fat stranding and edema about the duodenal and head of the  pancreas. The findings are likely secondary to severe duodenitis. Differential also includes pancreatitis with associated inflammation of the duodenum. Correlation with serum lipase/amylase would be helpful for further evaluation. 2. No extraluminal free air to suggest duodenal ulceration/perforation. 3.  Bowel loops are normal in caliber.  Normal appendix. 4.  No evidence of nephrolithiasis or hydronephrosis. 5.  Uterus and adnexa are unremarkable. Electronically Signed   By: IKeane PoliceD.O.   On: 01/07/2022 22:00     TODAY-DAY OF DISCHARGE:  Subjective:   CRenisha Cockrumtoday has no headache,no chest abdominal pain,no new weakness tingling or numbness, feels much better wants to go home today.   Objective:   Blood pressure 112/84, pulse 85, temperature 98.5 F (36.9 C), temperature source Oral, resp. rate 16, height '5\' 3"'  (1.6 m), weight 73.5 kg, SpO2 98 %, unknown if currently breastfeeding. No intake or output data in the 24 hours ending 01/12/22 1005 Filed Weights   01/07/22 1450  Weight: 73.5 kg    Exam: Awake Alert, Oriented *3, No  new F.N deficits, Normal affect [place].AT,PERRAL Supple Neck,No JVD, No cervical lymphadenopathy appriciated.  Symmetrical Chest wall movement, Good air movement bilaterally, CTAB RRR,No Gallops,Rubs or new Murmurs, No Parasternal Heave +ve B.Sounds, Abd Soft, Non tender, No organomegaly appriciated, No rebound -guarding or rigidity. No Cyanosis, Clubbing or edema, No new Rash or bruise   PERTINENT RADIOLOGIC STUDIES: No results found.   PERTINENT LAB RESULTS: CBC: Recent Labs    01/10/22 0348  WBC 9.2  HGB 10.8*  HCT 31.2*  PLT 168   CMET CMP     Component Value Date/Time   NA 137 01/12/2022 0329   K 3.6 01/12/2022 0329   CL 105 01/12/2022 0329   CO2 22 01/12/2022 0329   GLUCOSE 243 (H) 01/12/2022 0329   BUN 7 01/12/2022 0329   CREATININE 0.47 01/12/2022 0329   CALCIUM 9.1 01/12/2022 0329   PROT 5.3 (L) 01/10/2022 0348    ALBUMIN 2.2 (L) 01/10/2022 0348   AST 10 (L) 01/10/2022 0348   ALT 11 01/10/2022 0348   ALKPHOS 66 01/10/2022 0348   BILITOT 1.0 01/10/2022 0348   GFRNONAA >60 01/12/2022 0329   GFRAA >90 02/08/2014 1156    GFR Estimated Creatinine Clearance: 96.9 mL/min (by C-G formula based on SCr of 0.47 mg/dL). No results for input(s): "LIPASE", "AMYLASE" in the last 72 hours. No results for input(s): "CKTOTAL", "CKMB", "CKMBINDEX", "TROPONINI" in the last 72 hours. Invalid input(s): "POCBNP" No results for input(s): "DDIMER" in the last 72 hours. Recent Labs    01/10/22 0348  HGBA1C 11.4*   Recent Labs    01/10/22 0348  TRIG 270*   No results for input(s): "TSH", "T4TOTAL", "T3FREE", "THYROIDAB" in the last 72 hours.  Invalid input(s): "FREET3" No results for input(s): "VITAMINB12", "FOLATE", "FERRITIN", "TIBC", "IRON", "RETICCTPCT" in the last 72 hours. Coags: No results for input(s): "INR" in the last 72 hours.  Invalid input(s): "PT" Microbiology: No results found for this or any previous visit (from the past 240 hour(s)).  FURTHER DISCHARGE INSTRUCTIONS:  Get Medicines reviewed and adjusted: Please take all your medications with you for your next visit with your Primary MD  Laboratory/radiological data: Please request your Primary MD to go over all hospital tests and procedure/radiological results at the follow up, please ask your Primary MD to get all Hospital records sent to his/her office.  In some cases, they will be blood work, cultures and biopsy results pending at the time of your discharge. Please request that your primary care M.D. goes through all the records of your hospital data and follows up on these results.  Also Note the following: If you experience worsening of your admission symptoms, develop shortness of breath, life threatening emergency, suicidal or homicidal thoughts you must seek medical attention immediately by calling 911 or calling your MD  immediately  if symptoms less severe.  You must read complete instructions/literature along with all the possible adverse reactions/side effects for all the Medicines you take and that have been prescribed to you. Take any new Medicines after you have completely understood and accpet all the possible adverse reactions/side effects.   Do not drive when taking Pain medications or sleeping medications (Benzodaizepines)  Do not take more than prescribed Pain, Sleep and Anxiety Medications. It is not advisable to combine anxiety,sleep and pain medications without talking with your primary care practitioner  Special Instructions: If you have smoked or chewed Tobacco  in the last 2 yrs please stop smoking, stop any regular Alcohol  and or any  Recreational drug use.  Wear Seat belts while driving.  Please note: You were cared for by a hospitalist during your hospital stay. Once you are discharged, your primary care physician will handle any further medical issues. Please note that NO REFILLS for any discharge medications will be authorized once you are discharged, as it is imperative that you return to your primary care physician (or establish a relationship with a primary care physician if you do not have one) for your post hospital discharge needs so that they can reassess your need for medications and monitor your lab values.  Total Time spent coordinating discharge including counseling, education and face to face time equals greater than 30 minutes.  SignedOren Binet 01/12/2022 10:05 AM

## 2022-01-12 NOTE — Plan of Care (Signed)
  Problem: Education: Goal: Ability to describe self-care measures that may prevent or decrease complications (Diabetes Survival Skills Education) will improve Outcome: Progressing   Problem: Metabolic: Goal: Ability to maintain appropriate glucose levels will improve Outcome: Progressing   Problem: Nutritional: Goal: Maintenance of adequate nutrition will improve Outcome: Progressing   Problem: Skin Integrity: Goal: Risk for impaired skin integrity will decrease Outcome: Progressing   Problem: Tissue Perfusion: Goal: Adequacy of tissue perfusion will improve Outcome: Progressing   

## 2022-01-26 ENCOUNTER — Inpatient Hospital Stay (INDEPENDENT_AMBULATORY_CARE_PROVIDER_SITE_OTHER): Payer: Self-pay | Admitting: Primary Care

## 2022-02-03 ENCOUNTER — Encounter: Payer: No Typology Code available for payment source | Attending: Obstetrics and Gynecology | Admitting: Dietician

## 2022-02-03 ENCOUNTER — Encounter: Payer: Self-pay | Admitting: Dietician

## 2022-02-03 DIAGNOSIS — E119 Type 2 diabetes mellitus without complications: Secondary | ICD-10-CM | POA: Insufficient documentation

## 2022-02-03 NOTE — Patient Instructions (Addendum)
Aim for 150 minutes of physical activity weekly! -Walking is great  Eat more Non-Starchy Vegetables -Aim to make 1/2 of your plate vegetables  Minimize added sugars and refined grains -avoid sweet drinks  Choose whole foods over processed.  Make simple meals at home more often than eating out.  Consider asking about getting a continuous glucose monitor.  Plan:  Aim for 2-3 Carb Choices per meal (30-45 grams) +/- 1 either way  Aim for 0-15 Carbs per snack if hungry  Include protein in moderation with your meals and snacks Continue taking medication as directed by MD

## 2022-02-03 NOTE — Progress Notes (Signed)
Diabetes Self-Management Education  Visit Type: First/Initial  Appt. Start Time: 0930 Appt. End Time: 1024  02/03/2022  [name], identified by name and date of birth, is a 31 y.o. female with a diagnosis of Diabetes: Type 2.   ASSESSMENT  Pt is here today with a Spanish interpretor named Dub Mikes.   Primary concern: Pt is worried that she is going to be on insulin long term. She states that she feels that whatever she eats may be bad for her and she is worried she is eating the wrong things.   History includes:  type 2 diabetes, HLD.  Labs noted:  A1C 11.4 01/10/22 Medications include:  Novolog 70/30 20 units in morning and at night, metformin Supplements: MVI  Patient lives with husband and 4 kids. Pt does the shopping and cooking and states she cooks lunch and dinner.   Pt started insulin 2 weeks ago but has been on metformin for 2 years. Pt has concerns about how long she will be on insulin.   Pt states she goes to the gym 4 times a week for an hour, but if she misses the gym she will at least go walking for 30 minutes.   Weight 150 lb 4.8 oz (68.2 kg), unknown if currently breastfeeding. Body mass index is 26.62 kg/m.   Diabetes Self-Management Education - 02/03/22 0937       Visit Information   Visit Type First/Initial      Initial Visit   Diabetes Type Type 2    Date Diagnosed 2021    Are you currently following a meal plan? No    Are you taking your medications as prescribed? Yes      Health Coping   How would you rate your overall health? Good      Psychosocial Assessment   Patient Belief/Attitude about Diabetes Motivated to manage diabetes    What is the hardest part about your diabetes right now, causing you the most concern, or is the most worrisome to you about your diabetes?   Making healty food and beverage choices    Self-care barriers None    Self-management support Doctor's office    Other persons present Patient    Patient  Concerns Nutrition/Meal planning    Special Needs None    Preferred Learning Style No preference indicated    Learning Readiness Ready    How often do you need to have someone help you when you read instructions, pamphlets, or other written materials from your doctor or pharmacy? 3 - Sometimes    What is the last grade level you completed in school? 12th      Pre-Education Assessment   Patient understands the diabetes disease and treatment process. Needs Instruction    Patient understands incorporating nutritional management into lifestyle. Needs Instruction    Patient undertands incorporating physical activity into lifestyle. Needs Instruction    Patient understands using medications safely. Needs Instruction    Patient understands monitoring blood glucose, interpreting and using results Needs Instruction    Patient understands prevention, detection, and treatment of acute complications. Needs Instruction    Patient understands prevention, detection, and treatment of chronic complications. Needs Instruction    Patient understands how to develop strategies to address psychosocial issues. Needs Instruction    Patient understands how to develop strategies to promote health/change behavior. Needs Instruction      Complications   Last HgB A1C per patient/outside source 11.4 %    How often do you  check your blood sugar? 1-2 times/day    Fasting Blood glucose range (mg/dL) 694-854;[phone]    Postprandial Blood glucose range (mg/dL) [phone]    Number of hypoglycemic episodes per month 0    Number of hyperglycemic episodes ( >200mg /dL): Weekly    Have you had a dilated eye exam in the past 12 months? Yes    Have you had a dental exam in the past 12 months? No    Are you checking your feet? Yes    How many days per week are you checking your feet? 2      Dietary Intake   Breakfast 1 tortilla with chicken and tomato and cucumber    Snack (morning) none    Lunch soup with rice and vegetables  with chicken    Snack (afternoon) apple    Dinner tortilla with bean soup    Snack (evening) none    Beverage(s) water, diet soda and coffee with splenda.      Activity / Exercise   Activity / Exercise Type Moderate (swimming / aerobic walking)   gym   How many days per week do you exercise? 4    How many minutes per day do you exercise? 30    Total minutes per week of exercise 120      Patient Education   Previous Diabetes Education No    Disease Pathophysiology Definition of diabetes, type 1 and 2, and the diagnosis of diabetes;Factors that contribute to the development of diabetes;Explored patient's options for treatment of their diabetes    Healthy Eating Role of diet in the treatment of diabetes and the relationship between the three main macronutrients and blood glucose level;Carbohydrate counting;Plate Method;Reviewed blood glucose goals for pre and post meals and how to evaluate the patients' food intake on their blood glucose level.;Meal timing in regards to the patients' current diabetes medication.;Meal options for control of blood glucose level and chronic complications.    Being Active Role of exercise on diabetes management, blood pressure control and cardiac health.;Helped patient identify appropriate exercises in relation to his/her diabetes, diabetes complications and other health issue.    Medications Taught/reviewed insulin/injectables, injection, site rotation, insulin/injectables storage and needle disposal.;Reviewed patients medication for diabetes, action, purpose, timing of dose and side effects.;Reviewed medication adjustment guidelines for hyperglycemia and sick days.    Monitoring Identified appropriate SMBG and/or A1C goals.;Daily foot exams;Yearly dilated eye exam    Acute complications Trained/discussed glucagon administration to patient and designated other.;Discussed and identified patients' prevention, symptoms, and treatment of hyperglycemia.    Chronic  complications Relationship between chronic complications and blood glucose control;Lipid levels, blood glucose control and heart disease;Identified and discussed with patient  current chronic complications;Dental care;Retinopathy and reason for yearly dilated eye exams;Nephropathy, what it is, prevention of, the use of ACE, ARB's and early detection of through urine microalbumia.;Reviewed with patient heart disease, higher risk of, and prevention    Diabetes Stress and Support Identified and addressed patients feelings and concerns about diabetes;Worked with patient to identify barriers to care and solutions    Lifestyle and Health Coping Lifestyle issues that need to be addressed for better diabetes care      Individualized Goals (developed by patient)   Nutrition General guidelines for healthy choices and portions discussed    Physical Activity Exercise 5-7 days per week;30 minutes per day    Medications take my medication as prescribed    Monitoring  Test my blood glucose as discussed    Problem Solving Eating  Pattern    Reducing Risk examine blood glucose patterns;do foot checks daily    Health Coping Ask for help with psychological, social, or emotional issues      Post-Education Assessment   Patient understands the diabetes disease and treatment process. Comprehends key points    Patient understands incorporating nutritional management into lifestyle. Comprehends key points    Patient undertands incorporating physical activity into lifestyle. Demonstrates understanding / competency    Patient understands using medications safely. Demonstrates understanding / competency    Patient understands monitoring blood glucose, interpreting and using results Comprehends key points    Patient understands prevention, detection, and treatment of acute complications. Comprehends key points    Patient understands prevention, detection, and treatment of chronic complications. Comprehends key points     Patient understands how to develop strategies to address psychosocial issues. Comprehends key points    Patient understands how to develop strategies to promote health/change behavior. Comprehends key points      Outcomes   Expected Outcomes Demonstrated interest in learning. Expect positive outcomes    Future DMSE PRN    Program Status Completed             Individualized Plan for Diabetes Self-Management Training:   Learning Objective:  Patient will have a greater understanding of diabetes self-management. Patient education plan is to attend individual and/or group sessions per assessed needs and concerns.   Plan:   Patient Instructions  Aim for 150 minutes of physical activity weekly! -Walking is great  Eat more Non-Starchy Vegetables -Aim to make 1/2 of your plate vegetables  Minimize added sugars and refined grains -avoid sweet drinks  Choose whole foods over processed.  Make simple meals at home more often than eating out.  Consider asking about getting a continuous glucose monitor.  Plan:  Aim for 2-3 Carb Choices per meal (30-45 grams) +/- 1 either way  Aim for 0-15 Carbs per snack if hungry  Include protein in moderation with your meals and snacks Continue taking medication as directed by MD       Expected Outcomes:  Demonstrated interest in learning. Expect positive outcomes  Education material provided: Meal plan card and My Plate, Nutrition Care Manual: Carbohydrate Counting for People with Diabetes  If problems or questions, patient to contact team via:  Phone  Future DSME appointment: PRN

## 2022-02-11 ENCOUNTER — Inpatient Hospital Stay (INDEPENDENT_AMBULATORY_CARE_PROVIDER_SITE_OTHER): Payer: Self-pay | Admitting: Primary Care

## 2022-02-15 ENCOUNTER — Encounter (INDEPENDENT_AMBULATORY_CARE_PROVIDER_SITE_OTHER): Payer: Self-pay | Admitting: Primary Care

## 2022-02-15 ENCOUNTER — Ambulatory Visit (INDEPENDENT_AMBULATORY_CARE_PROVIDER_SITE_OTHER): Payer: Self-pay | Admitting: Primary Care

## 2022-02-15 VITALS — BP 119/83 | HR 91 | Resp 16 | Ht <= 58 in | Wt 147.6 lb

## 2022-02-15 DIAGNOSIS — Z7689 Persons encountering health services in other specified circumstances: Secondary | ICD-10-CM

## 2022-02-15 DIAGNOSIS — E781 Pure hyperglyceridemia: Secondary | ICD-10-CM

## 2022-02-15 DIAGNOSIS — K858 Other acute pancreatitis without necrosis or infection: Secondary | ICD-10-CM

## 2022-02-15 DIAGNOSIS — Z0000 Encounter for general adult medical examination without abnormal findings: Secondary | ICD-10-CM

## 2022-02-15 DIAGNOSIS — E119 Type 2 diabetes mellitus without complications: Secondary | ICD-10-CM

## 2022-02-15 LAB — GLUCOSE, POCT (MANUAL RESULT ENTRY): POC Glucose: 419 mg/dl — AB (ref 70–99)

## 2022-02-15 NOTE — Progress Notes (Unsigned)
[facility]   Subjective:  Shelia Ford is a 31 y.o. female presents for hospital follow up and establish care.  Patient presented to the emergency room with sudden onset of abdominal pain starting . She did experience some nausea and vomiting.  She described her pain in the epigastric region with radiation to her back. Admit date to the hospital was 01/07/22, patient was discharged from the hospital on 01/12/22, patient was admitted for: Acute pancreatitis,Diabetes mellitus without complication ([place]) and Hypertriglyceridemia.  Today she denies any abdominal pain or nausea vomiting diarrhea or constipation.Patient has No headache, No chest pain,  No new weakness tingling or numbness, No Cough - shortness of breath.  She denies polyuria, polydipsia, polyphagia or vision. Past Medical History:  Diagnosis Date   Diabetes mellitus without complication ([place])    History of 3 cesarean sections 02/08/2014   Needs repeat   Hyperlipidemia    Hypertriglyceridemia    Medical history non-contributory    Postprocedural pelvic peritoneal adhesions    4 prior c/s     No Known Allergies    Current Outpatient Medications on File Prior to Visit  Medication Sig Dispense Refill   acetaminophen (TYLENOL 8 HOUR) 650 MG CR tablet Take 1 tablet (650 mg total) by mouth every 8 (eight) hours as needed for pain. 15 tablet 0   blood glucose meter kit and supplies Dispense based on patient and insurance preference. Use up to four times daily as directed. (FOR ICD-10 E10.9, E11.9). 1 each 0   ezetimibe (ZETIA) 10 MG tablet Take 1 tablet (10 mg total) by mouth daily. 90 tablet 3   fenofibrate 160 MG tablet Take 1 tablet (160 mg total) by mouth daily. 90 tablet 3   Fingerstix Lancets MISC Use as directed to check blood sugars up to 4 times daily 100 each 0   glucose blood test strip Use as directed to check blood sugars up to 4 times daily 100 each 0   insulin aspart protamine - aspart  (NOVOLOG 70/30 MIX) (70-30) 100 UNIT/ML FlexPen Inject 20 Units into the skin 2 (two) times daily with a meal. 15 mL 0   insulin isophane & regular human KwikPen (NOVOLIN 70/30 KWIKPEN) (70-30) 100 UNIT/ML KwikPen Inject 20 Units into the skin 2 (two) times daily. These are your refills-please start taking this medication only after you have finished the insulin that you got from the hospital.DO NOT TAKE THIS INSULIN UNLESS Shelia Ford 15 mL 11   Insulin Pen Needle 32G X 4 MM MISC Use as directed with insulin. 200 each 0   Insulin Pen Needle 32G X 4 MM MISC 1 each by Does not apply route as needed. 200 each 0   metFORMIN (GLUCOPHAGE) 500 MG tablet Take 1 tablet (500 mg total) by mouth 2 (two) times daily with a meal. 60 tablet 11   Multiple Vitamin (MULTIVITAMIN WITH MINERALS) TABS tablet Take 1 tablet by mouth daily.     No current facility-administered medications on file prior to visit.   Comprehensive ROS Pertinent positive and negative noted in HPI    Objective:  BP 119/83   Pulse 91   Resp 16   Ht 4' 0.5" (1.232 m)   Wt 147 lb 9.6 oz (67 kg)   SpO2 95%   BMI 44.12 kg/m   Filed Weights   02/15/22 1513  Weight: 147 lb 9.6 oz (67 kg)    Physical Exam: General  Appearance: Well nourished, in no apparent distress. Eyes: PERRLA, EOMs, conjunctiva no swelling or erythema Sinuses: No Frontal/maxillary tenderness ENT/Mouth: Ext aud canals clear, TMs without erythema, bulging.  Hearing normal.  Neck: Supple, thyroid normal.  Respiratory: Respiratory effort normal, BS equal bilaterally without rales, rhonchi, wheezing or stridor.  Cardio: RRR with no MRGs. Brisk peripheral pulses without edema.  Abdomen: Soft, + BS.  Tender with palpation, no guarding, rebound, hernias, masses. Lymphatics: Non tender without lymphadenopathy.  Musculoskeletal: Full ROM, 5/5 strength, normal gait.  Skin: Warm, dry without rashes, lesions, ecchymosis.   Neuro: Cranial nerves intact. Normal muscle tone, no cerebellar symptoms. Sensation intact.  Psych: Awake and oriented X 3, normal affect, Insight and Judgment appropriate.    Assessment:  Shelia Ford was seen today for hospitalization follow-up.  Diagnoses and all orders for this visit:  Diabetes mellitus without complication ([place]) Discussed  co- morbidities with uncontrol diabetes  Complications -diabetic retinopathy, (close your eyes ? What do you see nothing) nephropathy decrease in kidney function- can lead to dialysis-on a machine 3 days a week to filter your kidney, neuropathy- numbness and tinging in your hands and feet,  increase risk of heart attack and stroke, and amputation due to decrease wound healing and circulation. Decrease your risk by taking medication daily as prescribed, monitor carbohydrates- foods that are high in carbohydrates are the following rice, potatoes, breads, sugars, and pastas.  Reduction in the intake (eating) will assist in lowering your blood sugars. Exercise daily at least 30 minutes daily.  Medication 70/30 insulin pens were dispensed at discharge patient was unaware that medication needs to be refrigerated and the life of the medication was 14 days.  Patient will continue to use current pens and refrigerate the other insulin pens.  She will follow-up with clinical pharmacist to reevaluate medication -     POCT glucose (manual entry) -     Microalbumin / creatinine urine ratio  Encounter to establish care Establish care with PCP  Hypertriglyceridemia 2/2 Other acute pancreatitis, unspecified complication status Discharged on for fibrate 160 mg mg twice daily and Zetia.  Educated patient on pancreatitis.  Health care maintenance -     HCV Ab w Reflex to Quant PCR  Other orders -     Interpretation:  This note has been created with Surveyor, quantity. Any transcriptional errors are unintentional.   Kerin Perna, NP 02/15/2022, 3:46 PM

## 2022-02-15 NOTE — Patient Instructions (Addendum)
Plan de alimentacin para la pancreatitis Pancreatitis Eating Plan La pancreatitis ocurre cuando el pncreas se irrita e hincha (inflama). El pncreas es una glndula pequea que est ubicada detrs del [place]. Ayuda al cuerpo a digerir los alimentos y regular el Dispensing optician. La pancreatitis puede afectar la forma en que el organismo digiere los alimentos, especialmente los alimentos con grasa. Tambin pueden presentarse otros sntomas, como dolor en el abdomen (dolor abdominal) o nuseas. Cuando una persona tiene pancreatitis, seguir un plan de alimentacin con bajo contenido de grasa puede ayudarla a controlar los sntomas y a recuperarse con mayor rapidez. Consulte con su mdico o nutricionista para crear un plan de alimentacin que sea adecuado para usted. Cules son algunos consejos para seguir este plan? Lea las etiquetas de los alimentos Utilice la informacin incluida en las etiquetas de los alimentos para Theatre manager un registro de la cantidad de grasa que consume: Consulte el tamao de la porcin. Busque la cantidad de grasas totales en gramos (g) por porcin. Los alimentos con bajo contenido de [place] tienen 3 g de grasa o menos por porcin. Los alimentos sin contenido de [place] tienen 0.5 g de grasa o menos por porcin. Lleve un registro de la cantidad de grasa que consume en funcin de la cantidad de porciones que come. Por ejemplo, si come 2 porciones, la cantidad de grasa que consume ser [employer] lo indicado en la etiqueta. Al ir de compras  Compre alimentos con bajo contenido de grasa o sin grasa, por ejemplo: Frutas y verduras frescas, congeladas o enlatadas. Cereales, que incluyan panes, pastas y arroz. Carne magra, carne de ave, pescado y otros alimentos proteicos. Productos lcteos semidescremados o descremados. Evite comprar productos de panadera y otros dulces hechos con [place], [place] y [place]. Evite comprar tentempis con grasa agregada, como cualquier  alimento con [place] o con sabor a queso. Al cocinar Quite la piel de las aves y el exceso de grasa de la carne. Limite la cantidad de grasa y aceite que [place] a 6 cucharaditas (30 ml) o menos por da. Para cocinar, opte por mtodos con bajo contenido de grasa, como hervir, asar, Interior and spatial designer, cocer al vapor y Teacher, music. Use aceite en aerosol para cocinar. Agregue caldo de pollo desgrasado para darle ms sabor y la humedad a Environmental education officer. Evite agregar crema para espesar las sopas y salsas. Use otros productos para espesar, por ejemplo, almidn de maz o pasta de tomate. Planificacin de las comidas  Consuma una dieta con bajo contenido de grasa como se lo haya indicado el nutricionista. En la [employer], esto significa no consumir ms de 55 a 65 g de Engineer, water. Coma comidas pequeas, con frecuencia, [place]. Por ejemplo, puede comer 5 o 6 comidas pequeas en lugar de 3 comidas abundantes. Beba suficiente lquido como para Theatre manager la orina de color amarillo plido. No beba alcohol. Si necesita ayuda para dejar de hacerlo, hable con el mdico. Limite la cantidad de cafena que consume, por ejemplo, caf negro, t negro y t verde, bebidas con cafena y bebidas energizantes. Planifique incluir alimentos variados en su alimentacin. Incluya frutas, verduras, cereales integrales y protenas magras, as como productos lcteos descremados o bajos en grasas. Necesita seguir una dieta equilibrada para tener una buena salud general. Informacin general Informe a su mdico o al nutricionista si tiene una prdida de peso no planificada con este plan de alimentacin. Es posible que le indiquen que siga una dieta de lquidos transparentes  o alimentos blandos cuando los sntomas vuelvan a aparecer, lo que se denomina "agudizacin". Hable con el mdico sobre cmo controlar su dieta durante la agudizacin de los sntomas. Tome vitaminas o suplementos como se lo haya indicado el mdico. Es  posible que necesite tomar los siguientes: Vitaminas adicionales que se disuelven en grasa (son liposolubles), como las vitaminas A, D, E y K. Suplementos nutricionales. Trabaje con un nutricionista, especialmente si tiene [employer], como obesidad, osteoporosis o diabetes mellitus. Algunas personas necesitan tratamientos adicionales como, por ejemplo: P[place]oras o cpsulas para reemplazar enzimas (terapia oral de reemplazo de enzimas pancreticas). Alimentacin a travs de una sonda en el estmago o el intestino (alimentacin enteral). Qu alimentos debo evitar? Lambert Mody Frutas fritas. Frutas servidas con mantequilla o crema. Verduras Verduras fritas. Verduras cocinadas con [place], queso o crema. Granos Bizcochos, waffles, donas, pasteles y medialunas. Tartas y galletitas. Palomitas de maz saborizadas con mantequilla. Galletas comunes. Carnes y otras protenas Cort[place] de carne con alto contenido de Lobbyist. Carne de ave con piel. Vsceras. Carne precocinada o curada, como embutidos o panes de carne. Huevos enteros. Frutos secos y Engineer, mining de frutos secos. Lcteos Leche entera y [place] con 2 % de grasa. [place] Helados de Mattel. Crema y [place] de crema y [place]. Queso, como el queso crema. Phoebe Sharps. Bebidas Vino, cerveza y licor. Es posible que los productos que se enumeran ms [place] no constituyan una lista completa de los alimentos y las bebidas que Nurse, adult. Consulte a un nutricionista para obtener ms informacin. Resumen La pancreatitis puede afectar la forma en que el organismo digiere los alimentos, especialmente los alimentos con grasa. Cuando una persona tiene pancreatitis, se le recomienda que siga un plan de alimentacin con bajo contenido de grasa para ayudarlo a recuperarse ms rpidamente y a Emergency planning/management officer. No beba alcohol. Limite la cantidad de cafena que consume y beba suficiente lquido para Theatre manager la orina de color amarillo  plido. Esta informacin no tiene Marine scientist el consejo del mdico. Asegrese de hacerle al mdico cualquier pregunta que tenga. Document Revised: 05/21/2021 Document Reviewed: 05/21/2021 Elsevier Patient Education  [place] de la diabetes mellitus tipo 2 en los adultos Type 2 Diabetes Mellitus, Diagnosis, Adult La diabetes tipo 2 (diabetes mellitus tipo 2) es una enfermedad a largo plazo (crnica). Puede ocurrir cuando existe uno de [place], o ambos: El pncreas no produce la cantidad suficiente de insulina. El cuerpo no reacciona de forma normal a la insulina que produce. La insulina permite que los azcares ingresen en las clulas del cuerpo. Si tiene diabetes tipo 2, los azcares no pueden ingresar en las clulas. Los azcares se Hydrologist. Esto causa un nivel elevado de azcar en la sangre. Cules son las causas? Se desconoce la causa exacta de esta afeccin. Qu incrementa el riesgo? Tener diabetes tipo 2 en su familia. Tener sobrepeso u obesidad. Ser sedentario. El cuerpo no reacciona de forma normal a la insulina que produce. Tener un nivel de azcar en la sangre ms alto de lo normal con el transcurso del [place]. Haber sufrido un tipo de diabetes durante un embarazo. Tener una afeccin que causa la formacin de pequeos sacos llenos de lquido en los ovarios. Cules son los signos o sntomas? Al principio, es posible que usted no presente sntomas. Sus sntomas empezarn lentamente. Estos pueden incluir los siguientes: Ms sed de lo normal. Ms Joycelyn Man de lo normal. Necesidad de orinar con mayor frecuencia que lo normal.  Perder peso sin proponrselo. Cansancio. Sensacin de debilidad. Ver las cosas borrosas. Manchas oscuras en la piel. Cmo se trata? Esta afeccin puede ser tratada por un experto en diabetes. Puede que deba hacer lo siguiente: Seguir un plan de alimentacin elaborado por un experto en alimentacin  (nutricionista). Hacer ejercicio con regularidad. Encontrar formas de lidiar con Dealer. Comprobar su nivel de azcar en la sangre con la frecuencia que le hayan indicado. Tomar medicamentos. El mdico fijar los objetivos del tratamiento para usted. Su nivel de azcar en la sangre debe estar en estos niveles: Antes de las comidas: de 80 a 130 mg/dl (de 4.4 a 7.2 mmol/l). Despus de las comidas: por debajo de 180 mg/dl (10 mmol/l). En los ltimos 2 a 3 meses: menos del 7 %. Siga estas instrucciones en su casa: [employer] para la diabetes o aplquese la [employer]. Tome los medicamentos como le hayan indicado para prevenir otros problemas causados por esta afeccin. Puede necesitar: Aspirina. Medicamentos para Advertising copywriter. Medicamentos para Aeronautical engineer presin arterial. Preguntas para hacerle al mdico Es necesario que me rena con un educador en el cuidado de la diabetes? Qu medicamentos necesito y cundo debo tomarlos? Qu necesitar para tratar mi afeccin en casa? Cundo debo comprobar mi azcar en la sangre? Dnde puedo encontrar un grupo de apoyo? A quin puedo llamar si tengo preguntas? Cundo es mi prxima visita con el mdico? Instrucciones generales Use los medicamentos de venta libre y los recetados solamente como se lo haya indicado el mdico. Consulting civil engineer a todas las visitas de seguimiento. Dnde buscar ms informacin Para obtener ayuda y orientacin, as como ms informacin sobre la diabetes, visite: American Diabetes Association (ADA) (Asociacin Estadounidense de la Diabetes): www.diabetes.org American Association of Diabetes Care and Education Specialists (ADCES) (Asociacin Estadounidense de Especialistas en Atencin y Educacin sobre la Diabetes): www.diabeteseducator.org International Diabetes Federation (IDF) (Federacin Internacional de Diabetes): MemberVerification.ca Comunquese con un mdico si: El nivel de azcar  en la sangre es igual o mayor que 240 mg/dl (13.3 mmol/dl) durante 2 das seguidos. Se ha sentido enfermo durante 2 o ms das y no mejora. Ha tenido fiebre durante 2 o ms das y no [place]. Tiene alguno de estos problemas durante ms de 6 horas: No puede comer ni beber. Siente que va a vomitar. Vomita. Presenta heces lquidas (diarrea). Solicite ayuda de inmediato si: El nivel de azcar en la sangre est por debajo de 54 mg/dl (3 mmol/l). Se siente desorientado (confundido). Tiene problemas para pensar con claridad. Tiene dificultad para respirar. Tiene niveles medios o altos de cetonas en la [place]. Estos sntomas pueden Sales executive. Solicite ayuda de inmediato. Comunquese con el servicio de emergencias de su localidad (911 en los [place]). No espere a ver si los sntomas desaparecen. No conduzca por sus propios medios [employer] Financial. Resumen La diabetes tipo 2 es una enfermedad de larga duracin. Es posible que el pncreas no produzca suficiente insulina o que el cuerpo no reaccione de forma normal a la insulina que produce. Esta afeccin se trata con un plan de alimentacin, cambios en el estilo de vida y medicamentos. El mdico fijar los objetivos del tratamiento para usted. Estos lo ayudarn a Visual merchandiser en un nivel saludable. Concurra a [place]. Esta informacin no tiene Marine scientist el consejo del mdico. Asegrese de hacerle al mdico cualquier pregunta que tenga. Document Revised: 08/14/2020 Document Reviewed: 08/14/2020 Elsevier Patient Education  2023 Elsevier Inc.  

## 2022-02-16 LAB — HCV AB W REFLEX TO QUANT PCR: HCV Ab: NONREACTIVE

## 2022-02-16 LAB — HCV INTERPRETATION

## 2022-02-16 LAB — MICROALBUMIN / CREATININE URINE RATIO
Creatinine, Urine: 32.2 mg/dL
Microalb/Creat Ratio: 246 mg/g creat — ABNORMAL HIGH (ref 0–29)
Microalbumin, Urine: 79.2 ug/mL

## 2022-02-25 ENCOUNTER — Encounter (INDEPENDENT_AMBULATORY_CARE_PROVIDER_SITE_OTHER): Payer: Self-pay

## 2022-03-23 ENCOUNTER — Ambulatory Visit: Payer: No Typology Code available for payment source | Admitting: Pharmacist

## 2022-04-20 ENCOUNTER — Ambulatory Visit (INDEPENDENT_AMBULATORY_CARE_PROVIDER_SITE_OTHER): Payer: Self-pay | Admitting: Primary Care

## 2023-11-01 ENCOUNTER — Encounter (HOSPITAL_COMMUNITY): Payer: Self-pay

## 2023-11-01 ENCOUNTER — Emergency Department (HOSPITAL_COMMUNITY): Payer: Self-pay

## 2023-11-01 ENCOUNTER — Inpatient Hospital Stay (HOSPITAL_COMMUNITY): Payer: Self-pay

## 2023-11-01 ENCOUNTER — Other Ambulatory Visit: Payer: Self-pay

## 2023-11-01 ENCOUNTER — Inpatient Hospital Stay (HOSPITAL_COMMUNITY)
Admission: EM | Admit: 2023-11-01 | Discharge: 2023-11-05 | DRG: 438 | Disposition: A | Discharge status: Home / Self Care | Payer: Self-pay | Attending: Internal Medicine | Admitting: Internal Medicine

## 2023-11-01 DIAGNOSIS — E7800 Pure hypercholesterolemia, unspecified: Secondary | ICD-10-CM | POA: Diagnosis present

## 2023-11-01 DIAGNOSIS — E875 Hyperkalemia: Secondary | ICD-10-CM | POA: Diagnosis present

## 2023-11-01 DIAGNOSIS — E876 Hypokalemia: Secondary | ICD-10-CM | POA: Diagnosis present

## 2023-11-01 DIAGNOSIS — D72829 Elevated white blood cell count, unspecified: Secondary | ICD-10-CM

## 2023-11-01 DIAGNOSIS — Z91199 Patient's noncompliance with other medical treatment and regimen due to unspecified reason: Secondary | ICD-10-CM

## 2023-11-01 DIAGNOSIS — K861 Other chronic pancreatitis: Secondary | ICD-10-CM | POA: Diagnosis present

## 2023-11-01 DIAGNOSIS — E871 Hypo-osmolality and hyponatremia: Secondary | ICD-10-CM | POA: Diagnosis present

## 2023-11-01 DIAGNOSIS — K5900 Constipation, unspecified: Secondary | ICD-10-CM | POA: Diagnosis present

## 2023-11-01 DIAGNOSIS — Z91148 Patient's other noncompliance with medication regimen for other reason: Secondary | ICD-10-CM

## 2023-11-01 DIAGNOSIS — Z555 Less than a high school diploma: Secondary | ICD-10-CM

## 2023-11-01 DIAGNOSIS — I10 Essential (primary) hypertension: Secondary | ICD-10-CM | POA: Diagnosis present

## 2023-11-01 DIAGNOSIS — Z603 Acculturation difficulty: Secondary | ICD-10-CM | POA: Diagnosis present

## 2023-11-01 DIAGNOSIS — Z98891 History of uterine scar from previous surgery: Secondary | ICD-10-CM

## 2023-11-01 DIAGNOSIS — R6510 Systemic inflammatory response syndrome (SIRS) of non-infectious origin without acute organ dysfunction: Secondary | ICD-10-CM | POA: Diagnosis present

## 2023-11-01 DIAGNOSIS — Z833 Family history of diabetes mellitus: Secondary | ICD-10-CM

## 2023-11-01 DIAGNOSIS — E8889 Other specified metabolic disorders: Secondary | ICD-10-CM | POA: Diagnosis present

## 2023-11-01 DIAGNOSIS — K858 Other acute pancreatitis without necrosis or infection: Principal | ICD-10-CM | POA: Diagnosis present

## 2023-11-01 DIAGNOSIS — K859 Acute pancreatitis without necrosis or infection, unspecified: Principal | ICD-10-CM | POA: Diagnosis present

## 2023-11-01 DIAGNOSIS — E781 Pure hyperglyceridemia: Secondary | ICD-10-CM | POA: Diagnosis present

## 2023-11-01 DIAGNOSIS — E1110 Type 2 diabetes mellitus with ketoacidosis without coma: Secondary | ICD-10-CM | POA: Diagnosis present

## 2023-11-01 DIAGNOSIS — K759 Inflammatory liver disease, unspecified: Secondary | ICD-10-CM | POA: Diagnosis present

## 2023-11-01 DIAGNOSIS — Z7984 Long term (current) use of oral hypoglycemic drugs: Secondary | ICD-10-CM

## 2023-11-01 DIAGNOSIS — E782 Mixed hyperlipidemia: Secondary | ICD-10-CM

## 2023-11-01 LAB — CBC
HCT: 40.4 % (ref 36.0–46.0)
HCT: 43.1 % (ref 36.0–46.0)
Hemoglobin: 15 g/dL (ref 12.0–15.0)
Hemoglobin: 15.4 g/dL — ABNORMAL HIGH (ref 12.0–15.0)
MCH: 31.3 pg (ref 26.0–34.0)
MCH: 30.9 pg (ref 26.0–34.0)
MCHC: 37.1 g/dL — ABNORMAL HIGH (ref 30.0–36.0)
MCHC: 35.7 g/dL (ref 30.0–36.0)
MCV: 84.3 fL (ref 80.0–100.0)
MCV: 86.4 fL (ref 80.0–100.0)
Platelets: 273 10*3/uL (ref 150–400)
Platelets: 272 10*3/uL (ref 150–400)
RBC: 4.79 MIL/uL (ref 3.87–5.11)
RBC: 4.99 MIL/uL (ref 3.87–5.11)
RDW: 11.9 % (ref 11.5–15.5)
RDW: 12.2 % (ref 11.5–15.5)
WBC: 15 10*3/uL — ABNORMAL HIGH (ref 4.0–10.5)
WBC: 15.1 10*3/uL — ABNORMAL HIGH (ref 4.0–10.5)
nRBC: 0.1 % (ref 0.0–0.2)
nRBC: 0 % (ref 0.0–0.2)

## 2023-11-01 LAB — URINALYSIS, ROUTINE W REFLEX MICROSCOPIC
Bilirubin Urine: NEGATIVE
Glucose, UA: >=500 mg/dL — AB
Hgb urine dipstick: NEGATIVE
Ketones, ur: 80 mg/dL — AB
Leukocytes,Ua: NEGATIVE
Nitrite: NEGATIVE
Protein, ur: 100 mg/dL — AB
Specific Gravity, Urine: 1.036 — ABNORMAL HIGH (ref 1.005–1.030)
pH: 5 (ref 5.0–8.0)

## 2023-11-01 LAB — COMPREHENSIVE METABOLIC PANEL WITH GFR
ALT: 58 U/L — ABNORMAL HIGH (ref 0–44)
AST: 43 U/L — ABNORMAL HIGH (ref 15–41)
Albumin: 3.7 g/dL (ref 3.5–5.0)
Alkaline Phosphatase: 81 U/L (ref 38–126)
Anion gap: 14 (ref 5–15)
BUN: 8 mg/dL (ref 6–20)
CO2: 17 mmol/L — ABNORMAL LOW (ref 22–32)
Calcium: 8.4 mg/dL — ABNORMAL LOW (ref 8.9–10.3)
Chloride: 89 mmol/L — ABNORMAL LOW (ref 98–111)
Creatinine, Ser: 0.6 mg/dL (ref 0.44–1.00)
GFR, Estimated: >60 mL/min (ref >60)
Glucose, Bld: 316 mg/dL — ABNORMAL HIGH (ref 70–99)
Potassium: 3.5 mmol/L (ref 3.5–5.1)
Sodium: 120 mmol/L — ABNORMAL LOW (ref 135–145)
Total Bilirubin: 0.4 mg/dL (ref 0.0–1.2)
Total Protein: 7.3 g/dL (ref 6.5–8.1)

## 2023-11-01 LAB — GLUCOSE, CAPILLARY
Glucose-Capillary: 259 mg/dL — ABNORMAL HIGH (ref 70–99)
Glucose-Capillary: 270 mg/dL — ABNORMAL HIGH (ref 70–99)
Glucose-Capillary: 270 mg/dL — ABNORMAL HIGH (ref 70–99)
Glucose-Capillary: 271 mg/dL — ABNORMAL HIGH (ref 70–99)
Glucose-Capillary: 276 mg/dL — ABNORMAL HIGH (ref 70–99)
Glucose-Capillary: 281 mg/dL — ABNORMAL HIGH (ref 70–99)
Glucose-Capillary: 285 mg/dL — ABNORMAL HIGH (ref 70–99)

## 2023-11-01 LAB — TRIGLYCERIDES
Triglycerides: >5000 mg/dL — ABNORMAL HIGH (ref <150)
Triglycerides: >5000 mg/dL — ABNORMAL HIGH (ref <150)

## 2023-11-01 LAB — CREATININE, SERUM
Creatinine, Ser: 0.65 mg/dL (ref 0.44–1.00)
GFR, Estimated: >60 mL/min (ref >60)

## 2023-11-01 LAB — BLOOD GAS, VENOUS
Acid-base deficit: 9.1 mmol/L — ABNORMAL HIGH (ref 0.0–2.0)
Bicarbonate: 14.9 mmol/L — ABNORMAL LOW (ref 20.0–28.0)
O2 Saturation: 94.4 %
Patient temperature: 37.5
pCO2, Ven: 28 mmHg — ABNORMAL LOW (ref 44–60)
pH, Ven: 7.34 (ref 7.25–7.43)
pO2, Ven: 69 mmHg — ABNORMAL HIGH (ref 32–45)

## 2023-11-01 LAB — CBG MONITORING, ED
Glucose-Capillary: 345 mg/dL — ABNORMAL HIGH (ref 70–99)
Glucose-Capillary: 299 mg/dL — ABNORMAL HIGH (ref 70–99)
Glucose-Capillary: 289 mg/dL — ABNORMAL HIGH (ref 70–99)
Glucose-Capillary: 323 mg/dL — ABNORMAL HIGH (ref 70–99)
Glucose-Capillary: 278 mg/dL — ABNORMAL HIGH (ref 70–99)
Glucose-Capillary: 283 mg/dL — ABNORMAL HIGH (ref 70–99)
Glucose-Capillary: 259 mg/dL — ABNORMAL HIGH (ref 70–99)

## 2023-11-01 LAB — BETA-HYDROXYBUTYRIC ACID: Beta-Hydroxybutyric Acid: 2.41 mmol/L — ABNORMAL HIGH (ref 0.05–0.27)

## 2023-11-01 LAB — TROPONIN I (HIGH SENSITIVITY)
Troponin I (High Sensitivity): 7 ng/L (ref <18)
Troponin I (High Sensitivity): 5 ng/L (ref <18)

## 2023-11-01 LAB — LIPASE, BLOOD: Lipase: 989 U/L — ABNORMAL HIGH (ref 11–51)

## 2023-11-01 LAB — HCG, SERUM, QUALITATIVE: Preg, Serum: NEGATIVE

## 2023-11-01 LAB — PREGNANCY, URINE: Preg Test, Ur: NEGATIVE

## 2023-11-01 LAB — HIV ANTIBODY (ROUTINE TESTING W REFLEX): HIV Screen 4th Generation wRfx: NONREACTIVE

## 2023-11-01 MED ORDER — ONDANSETRON 4 MG PO TBDP
4.0000 mg | ORAL_TABLET | Freq: Once | ORAL | Status: AC | PRN
  Administered 2023-11-01: 4 mg via ORAL
  Filled 2023-11-01: qty 1

## 2023-11-01 MED ORDER — HYDROMORPHONE HCL 1 MG/ML IJ SOLN
1.0000 mg | Freq: Once | INTRAMUSCULAR | Status: AC
  Administered 2023-11-01: 1 mg via INTRAVENOUS
  Filled 2023-11-01: qty 1

## 2023-11-01 MED ORDER — INSULIN (MYXREDLIN) INFUSION FOR HYPERTRIGLYCERIDEMIA
0.1000 [IU]/kg/h | INTRAVENOUS | Status: DC
  Administered 2023-11-01 – 2023-11-03 (×4): 0.1 [IU]/kg/h via INTRAVENOUS
  Filled 2023-11-01 (×4): qty 100

## 2023-11-01 MED ORDER — LACTATED RINGERS IV SOLN: INTRAVENOUS | Status: DC

## 2023-11-01 MED ORDER — DILTIAZEM HCL 25 MG/5ML IV SOLN
5.0000 mg | Freq: Once | INTRAVENOUS | Status: AC
  Administered 2023-11-01: 5 mg via INTRAVENOUS
  Filled 2023-11-01: qty 5

## 2023-11-01 MED ORDER — METOPROLOL TARTRATE 5 MG/5ML IV SOLN
5.0000 mg | INTRAVENOUS | Status: AC
  Administered 2023-11-02: 5 mg via INTRAVENOUS
  Filled 2023-11-01: qty 5

## 2023-11-01 MED ORDER — ALUM & MAG HYDROXIDE-SIMETH 200-200-20 MG/5ML PO SUSP
15.0000 mL | Freq: Once | ORAL | Status: AC
  Administered 2023-11-01: 15 mL via ORAL
  Filled 2023-11-01: qty 30

## 2023-11-01 MED ORDER — DEXTROSE IN LACTATED RINGERS 5 % IV SOLN: INTRAVENOUS | Status: DC

## 2023-11-01 MED ORDER — DEXTROSE 5 % IV SOLN: INTRAVENOUS | Status: DC

## 2023-11-01 MED ORDER — SODIUM CHLORIDE 0.9 % IV BOLUS
500.0000 mL | Freq: Once | INTRAVENOUS | Status: AC
  Administered 2023-11-02: 500 mL via INTRAVENOUS

## 2023-11-01 MED ORDER — LACTATED RINGERS IV BOLUS
1000.0000 mL | Freq: Once | INTRAVENOUS | Status: AC
  Administered 2023-11-01: 1000 mL via INTRAVENOUS

## 2023-11-01 MED ORDER — IOHEXOL 350 MG/ML SOLN
75.0000 mL | Freq: Once | INTRAVENOUS | Status: AC | PRN
  Administered 2023-11-01: 75 mL via INTRAVENOUS

## 2023-11-01 MED ORDER — ACETAMINOPHEN 325 MG PO TABS
650.0000 mg | ORAL_TABLET | Freq: Four times a day (QID) | ORAL | Status: DC | PRN
  Administered 2023-11-01 – 2023-11-02 (×2): 650 mg via ORAL
  Filled 2023-11-01 (×2): qty 2

## 2023-11-01 MED ORDER — ONDANSETRON HCL 4 MG/2ML IJ SOLN
4.0000 mg | Freq: Four times a day (QID) | INTRAMUSCULAR | Status: DC | PRN
  Administered 2023-11-01: 4 mg via INTRAVENOUS
  Filled 2023-11-01 (×2): qty 2

## 2023-11-01 MED ORDER — ENOXAPARIN SODIUM 40 MG/0.4ML IJ SOSY
40.0000 mg | PREFILLED_SYRINGE | INTRAMUSCULAR | Status: DC
  Administered 2023-11-01 – 2023-11-04 (×4): 40 mg via SUBCUTANEOUS
  Filled 2023-11-01 (×4): qty 0.4

## 2023-11-01 MED ORDER — ONDANSETRON HCL 4 MG/2ML IJ SOLN: 4.0000 mg | Freq: Once | INTRAMUSCULAR | Status: DC

## 2023-11-01 MED ORDER — HYDROMORPHONE HCL 1 MG/ML IJ SOLN
0.5000 mg | Freq: Once | INTRAMUSCULAR | Status: AC
  Administered 2023-11-01: 0.5 mg via INTRAVENOUS
  Filled 2023-11-01: qty 1

## 2023-11-01 MED ORDER — ONDANSETRON HCL 4 MG/2ML IJ SOLN
INTRAMUSCULAR | Status: AC
  Administered 2023-11-01: 4 mg via INTRAVENOUS
  Filled 2023-11-01: qty 2

## 2023-11-01 MED ORDER — HYDROMORPHONE HCL 1 MG/ML IJ SOLN
1.0000 mg | INTRAMUSCULAR | Status: DC | PRN
  Administered 2023-11-01 – 2023-11-02 (×7): 1 mg via INTRAVENOUS
  Filled 2023-11-01 (×8): qty 1

## 2023-11-01 MED ORDER — HYDROMORPHONE HCL 1 MG/ML IJ SOLN
0.5000 mg | INTRAMUSCULAR | Status: DC | PRN
  Administered 2023-11-01 (×2): 0.5 mg via INTRAVENOUS
  Filled 2023-11-01 (×2): qty 1

## 2023-11-01 NOTE — ED Notes (Signed)
 Lab tech notified to add patient's triglyceride test.

## 2023-11-01 NOTE — ED Notes (Signed)
 Patient transported to CT scan .

## 2023-11-01 NOTE — Plan of Care (Signed)

## 2023-11-01 NOTE — ED Notes (Signed)
 Up to b/r with assist. Slow cautious steady gait. Provided w/c. Endorses some dizziness.

## 2023-11-01 NOTE — Progress Notes (Addendum)
 Assessment/admission information obtained with Interpreter Lorette [phone]. Patient arrived to unit AAOx4. C/o abdominal pain 6/10, PRN given. Denies SOB and chest pain at this time. Orientation to unit provided. HR 150-160's and temp 99.1 Dr Georgina made aware, no new orders at this time. Insulin  gtt and IVF running as ordered. Skin intact. [name] educated on fall prevention and she agreed to call and not get OOB without staff present. Call bell and belongings in reach.

## 2023-11-01 NOTE — ED Provider Notes (Signed)
 [facility] DEPT [facility] Emergency Department Provider Note MRN:  [id number]  Arrival date & time: 11/01/23     Chief Complaint   Abdominal Pain   History of Present Illness   Shelia Ford is a 33 y.o. year-old female presents to the ED with chief complaint of strict abdominal pain that started yesterday.  She states that she has felt similar in the remote past.  She reports associated nausea, but has not had vomiting.  She denies fevers or chills.  Denies any dysuria.  She states that she feels a little better after Zofran  that was given in triage.  She still complains of some burning sensation in the epigastrium.  Hx of hypertriglyceridemia.   History provided by patient.   Review of Systems  Pertinent positive and negative review of systems noted in HPI.    Physical Exam   Vitals:   11/01/23 0209 11/01/23 0433  BP:  (!) 159/107  Pulse:  95  Resp:  16  Temp:    SpO2: 100% 100%    CONSTITUTIONAL:  uncomfortable-appearing, NAD NEURO:  Alert and oriented x 3, CN 3-12 grossly intact EYES:  eyes equal and reactive ENT/NECK:  Supple, no stridor  CARDIO:  normal rate, regular rhythm, appears well-perfused  PULM:  No respiratory distress, CTAB GI/GU:  non-distended, epigastric TTP MSK/SPINE:  No gross deformities, no edema, moves all extremities  SKIN:  no rash, atraumatic   *Additional and/or pertinent findings included in MDM below  Diagnostic and Interventional Summary    EKG Interpretation Date/Time:    Ventricular Rate:    PR Interval:    QRS Duration:    QT Interval:    QTC Calculation:   R Axis:      Text Interpretation:         Labs Reviewed  LIPASE, BLOOD - Abnormal; Notable for the following components:      Result Value   Lipase 989 (*)    All other components within normal limits  COMPREHENSIVE METABOLIC PANEL WITH GFR - Abnormal; Notable for the following components:   Sodium 120 (*)    Chloride 89 (*)    CO2 17 (*)     Glucose, Bld 316 (*)    Calcium  8.4 (*)    AST 43 (*)    ALT 58 (*)    All other components within normal limits  URINALYSIS, ROUTINE W REFLEX MICROSCOPIC - Abnormal; Notable for the following components:   APPearance HAZY (*)    Specific Gravity, Urine 1.036 (*)    Glucose, UA >=500 (*)    Ketones, ur 80 (*)    Protein, ur 100 (*)    Bacteria, UA RARE (*)    All other components within normal limits  CBC - Abnormal; Notable for the following components:   WBC 15.0 (*)    MCHC 37.1 (*)    All other components within normal limits  CBG MONITORING, ED - Abnormal; Notable for the following components:   Glucose-Capillary 345 (*)    All other components within normal limits  HCG, SERUM, QUALITATIVE  PREGNANCY, URINE  TRIGLYCERIDES    CT ABDOMEN PELVIS W CONTRAST    (Results Pending)    Medications  lactated ringers  infusion (has no administration in time range)  ondansetron  (ZOFRAN -ODT) disintegrating tablet 4 mg (4 mg Oral Given 11/01/23 0218)  alum & mag hydroxide-simeth (MAALOX/MYLANTA) 200-200-20 MG/5ML suspension 15 mL (15 mLs Oral Given 11/01/23 0438)  lactated ringers  bolus 1,000 mL (1,000 mLs Intravenous  New Bag/Given 11/01/23 0458)  HYDROmorphone  (DILAUDID ) injection 1 mg (1 mg Intravenous Given 11/01/23 0515)  iohexol  (OMNIPAQUE ) 350 MG/ML injection 75 mL (75 mLs Intravenous Contrast Given 11/01/23 0549)     Procedures  /  Critical Care Procedures  ED Course and Medical Decision Making  I have reviewed the triage vital signs, the nursing notes, and pertinent available records from the EMR.  Social Determinants Affecting Complexity of Care: Patient has no clinically significant social determinants affecting this chief complaint..   ED Course: Clinical Course as of 11/01/23 0552  Tue Nov 01, 2023  0502 Abdominal pain, hyponatremia 120 AOC Pancreatitis [CC]  0551 Lipase, blood(!) Consistent with pancreatitis [RB]  0552 Comprehensive metabolic  panel(!) Hyponatremic, denies ETOH use... ? Beer drinker potamania  [RB]  [id number] CBC(!) Leukocytosis to 15 [RB]    Clinical Course User Index [CC] Jerral Meth, MD [RB] Vicky Charleston, PA-C    Medical Decision Making Patient here with epigastric pain.  Onset yesterday.  Hx of pancreatitis 2/2 hypertriglyceridemia.  Denies ETOH use.   Lipase is very high at 989.  Will start fluids.  Will check CT.    Amount and/or Complexity of Data Reviewed Labs: ordered. Radiology: ordered. ECG/medicine tests: ordered.  Risk OTC drugs. Prescription drug management. Decision regarding hospitalization.         Consultants: I consulted with Hospitalist, Dr. Franky, who is appreciated for admitting.   Treatment and Plan: Patient's exam and diagnostic results are concerning for pancreatitis.  Feel that patient will need admission to the hospital for further treatment and evaluation.    Final Clinical Impressions(s) / ED Diagnoses     ICD-10-CM   1. Acute pancreatitis, unspecified complication status, unspecified pancreatitis type  K85.90     2. Hyponatremia  E87.1       ED Discharge Orders     None         Discharge Instructions Discussed with and Provided to Patient:   Discharge Instructions   None      Vicky Charleston, PA-C 11/01/23 9447    Jerral Meth, MD 11/01/23 [phone]

## 2023-11-01 NOTE — ED Notes (Signed)
 Labs redrawn and sent to lab at [phone]

## 2023-11-01 NOTE — ED Notes (Signed)
 Checked patient blood sugar it was 256 notified RN Thom of blood sugar patient is resting with family at bedside

## 2023-11-01 NOTE — ED Notes (Signed)
 Sleeping, resting, arousable to voice, endorses pain and nausea. Alert, NAD, calm, interactive, speech clear.

## 2023-11-01 NOTE — ED Notes (Signed)
 Liter bolus finished and heart rate remains unchanged, MD notified.

## 2023-11-01 NOTE — H&P (Addendum)
 History and Physical    Patient: Shelia Ford FMW:[id number] DOB: 1990-10-05 DOA: 11/01/2023 DOS: the patient was seen and examined on 11/01/2023 PCP: Celestia Rosaline SQUIBB, NP  Patient coming from: Home  Chief Complaint:  Chief Complaint  Patient presents with   Abdominal Pain   HPI: Shelia Ford is a 33 y.o. 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  Pt is a native Spanish speake and pre-occupied with her abdominal pain at the time of the exam. From what I can gather per Epic and brief interview with patient, she had pain in her belly for the past 3 days for which she took tylenol  1g BID without relief; as such, she presented to the ED for ongoing care. She endorses a previous episode of pancreatitis 4 years ago, and denies alcohol use.  In the ED, pt febrile, hypertensive, tachycardic and tachypneic on RA. Labs notable for Na 120, Cr 0.6, lipase 989, triglycerides >5000, WBC 15, and glucose 316. CT abd showed acute pancreatitis. Pt admitted to medicine for ongoing care.  Review of Systems: As mentioned in the history of present illness. All other systems reviewed and are negative. Past Medical History:  Diagnosis Date   Diabetes mellitus without complication (HCC)    History of 3 cesarean sections 02/08/2014   Needs repeat   Hyperlipidemia    Hypertriglyceridemia    Medical history non-contributory    Postprocedural pelvic peritoneal adhesions    4 prior c/s   Past Surgical History:  Procedure Laterality Date   CESAREAN SECTION     C/S x 4--low vertical T extension with 4th.   CESAREAN SECTION N/A 03/22/2014   Procedure: CESAREAN SECTION;  Surgeon: Norleen Edsel GAILS, MD;  Location: [facility];  Service: Obstetrics;  Laterality: N/A;   Social History:  reports that she has never smoked. She has never used smokeless tobacco. She reports that she does not drink alcohol and does not use  drugs.  No Known Allergies  Family History  Problem Relation Age of Onset   Diabetes Mother    Diabetes Maternal Aunt     Prior to Admission medications   Medication Sig Start Date End Date Taking? Authorizing Provider  acetaminophen  (TYLENOL  8 HOUR) 650 MG CR tablet Take 1 tablet (650 mg total) by mouth every 8 (eight) hours as needed for pain. 10/21/18  Yes Petrucelli, Samantha R, PA-C  atorvastatin  (LIPITOR ) 40 MG tablet Take 40 mg by mouth daily. for cholesterol. 10/31/23  Yes [provider]  fenofibrate  (TRICOR ) 145 MG tablet Take 145 mg by mouth daily. for cholesterol. 10/31/23  Yes [provider]  glipiZIDE (GLUCOTROL) 10 MG tablet Take 10 mg by mouth 2 (two) times daily before a meal. 10/31/23  Yes [provider]  metFORMIN  (GLUCOPHAGE ) 1000 MG tablet Take 1,000 mg by mouth 2 (two) times daily with a meal. 10/31/23  Yes [provider]    Physical Exam: Vitals:   11/01/23 0210 11/01/23 0433 11/01/23 0555 11/01/23 0955  BP:  (!) 159/107  (!) 161/121  Pulse:  95  (!) 145  Resp:  16  (!) 22  Temp:   (!) 97.4 F (36.3 C) (!) 100.6 F (38.1 C)  TempSrc:   Temporal Oral  SpO2:  100%  100%  Weight: 67.1 kg     Height: 4' 6 (1.372 m)      General: Alert, oriented x3, resting comfortably in no acute distress Respiratory: Lungs  clear to auscultation bilaterally with normal respiratory effort; no w/r/r Cardiovascular: Regular rate and rhythm w/o m/r/g Abdomen: Soft, nondistended. TTP diffusely. Positive bowel sounds   Data Reviewed:  Lab Results  Component Value Date   WBC 15.0 (H) 11/01/2023   HGB 15.0 11/01/2023   HCT 40.4 11/01/2023   MCV 84.3 11/01/2023   PLT 273 11/01/2023   Lab Results  Component Value Date   GLUCOSE 316 (H) 11/01/2023   CALCIUM  8.4 (L) 11/01/2023   NA 120 (L) 11/01/2023   K 3.5 11/01/2023   CO2 17 (L) 11/01/2023   CL 89 (L) 11/01/2023   BUN 8 11/01/2023   CREATININE 0.60 11/01/2023   Lab Results   Component Value Date   ALT 58 (H) 11/01/2023   AST 43 (H) 11/01/2023   ALKPHOS 81 11/01/2023   BILITOT 0.4 11/01/2023   No results found for: INR, PROTIME  Radiology: CT ABDOMEN PELVIS W CONTRAST Result Date: 11/01/2023 EXAM: CT ABDOMEN AND PELVIS WITH CONTRAST 11/01/2023 05:48:37 AM TECHNIQUE: CT of the abdomen and pelvis was performed with the administration of intravenous contrast. Multiplanar reformatted images are provided for review. Automated exposure control, iterative reconstruction, and/or weight based adjustment of the mA/kV was utilized to reduce the radiation dose to as low as reasonably achievable. COMPARISON: CT of the abdomen and pelvis 01/07/2022. CLINICAL HISTORY: Pancreatitis, acute, severe. Pt complaining of nausea and vomiting since 4 pm. Has pain in the epigastric area. FINDINGS: LOWER CHEST: No acute abnormality. LIVER: Mild fatty infiltration of the liver is present. No discrete lesions are present. GALLBLADDER AND BILE DUCTS: Gallbladder is unremarkable. No biliary ductal dilatation. SPLEEN: No acute abnormality. PANCREAS: Diffuse inflammatory changes are present about the head and proximal body of the pancreas. The pancreas enhances throughout. No focal mass lesion or cyst formation is present. Secondary inflammatory changes surround the distal stomach and duodenum. ADRENAL GLANDS: No acute abnormality. KIDNEYS, URETERS AND BLADDER: No stones in the kidneys or ureters. No hydronephrosis. No perinephric or periureteral stranding. Moderate distension of the urinary bladder is present. The bladder measures 15 cm cephalocaudad. GI AND BOWEL: Stomach demonstrates no acute abnormality. There is no bowel obstruction. No bowel wall thickening. PERITONEUM AND RETROPERITONEUM: No ascites. No free air. VASCULATURE: Aorta is normal in caliber. LYMPH NODES: No lymphadenopathy. REPRODUCTIVE ORGANS: No acute abnormality. BONES AND SOFT TISSUES: No acute osseous abnormality. No focal soft  tissue abnormality. IMPRESSION: 1. Acute pancreatitis with diffuse inflammatory changes about the head and proximal body of the pancreas, as well as secondary inflammatory changes surrounding the distal stomach and duodenum. No focal mass lesion or cyst formation. 2. Mild hepatic steatosis without discrete lesions. 3. Moderate distension of the urinary bladder, measuring 15 cm cephalocaudad. 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  Hypertriglyceridemic pancreatitis  -Insulin  gtt per protocol w/ q1h CBGs -LR at 100cc/h for now -D5LR at 150cc/h for now to prevent hypoglycemia -IV dilaudid  1mg  q3h prn for now -IV zofran  prn -F/u triglycerides q12h -NPO for now  Persistent tachycardia Presumably related to above -F/u D-dimer +/- CTA chest PE protocol to exclude VTE -F/u BMP to eval anion gap -F/u VBG   Advance Care Planning:   Code Status: Full Code   Consults: N/A  Family Communication: N/A  Severity of Illness: The appropriate patient status for this patient is INPATIENT. Inpatient status is judged to be reasonable  and necessary in order to provide the required intensity of service to ensure the patient's safety. The patient's presenting symptoms, physical exam findings, and initial radiographic and laboratory data in the context of their chronic comorbidities is felt to place them at high risk for further clinical deterioration. Furthermore, it is not anticipated that the patient will be medically stable for discharge from the hospital within 2 midnights of admission.   * I certify that at the point of admission it is my clinical judgment that the patient will require inpatient hospital care spanning beyond 2 midnights from the point of admission due to high intensity of service, high risk for further deterioration and high  frequency of surveillance required.*   ------- I spent 55 minutes reviewing previous labs/notes, obtaining separate history at the bedside, counseling/discussing the treatment plan outlined above, ordering medications/tests, and performing clinical documentation.  Author: Marsha Ada, MD 11/01/2023 10:14 AM  For on call review [email or web address].

## 2023-11-01 NOTE — ED Triage Notes (Signed)
 Pt complaining of nausea and vomiting since 4 pm. Has pain in the epigastric area. Has not taken anything for this.

## 2023-11-02 ENCOUNTER — Other Ambulatory Visit (HOSPITAL_COMMUNITY): Payer: Self-pay

## 2023-11-02 DIAGNOSIS — K859 Acute pancreatitis without necrosis or infection, unspecified: Secondary | ICD-10-CM | POA: Diagnosis not present

## 2023-11-02 DIAGNOSIS — Z7984 Long term (current) use of oral hypoglycemic drugs: Secondary | ICD-10-CM

## 2023-11-02 DIAGNOSIS — D72829 Elevated white blood cell count, unspecified: Secondary | ICD-10-CM

## 2023-11-02 DIAGNOSIS — E119 Type 2 diabetes mellitus without complications: Secondary | ICD-10-CM

## 2023-11-02 DIAGNOSIS — E871 Hypo-osmolality and hyponatremia: Secondary | ICD-10-CM

## 2023-11-02 LAB — GLUCOSE, CAPILLARY
Glucose-Capillary: 240 mg/dL — ABNORMAL HIGH (ref 70–99)
Glucose-Capillary: 213 mg/dL — ABNORMAL HIGH (ref 70–99)
Glucose-Capillary: 202 mg/dL — ABNORMAL HIGH (ref 70–99)
Glucose-Capillary: 205 mg/dL — ABNORMAL HIGH (ref 70–99)
Glucose-Capillary: 212 mg/dL — ABNORMAL HIGH (ref 70–99)
Glucose-Capillary: 205 mg/dL — ABNORMAL HIGH (ref 70–99)
Glucose-Capillary: 205 mg/dL — ABNORMAL HIGH (ref 70–99)
Glucose-Capillary: 192 mg/dL — ABNORMAL HIGH (ref 70–99)
Glucose-Capillary: 169 mg/dL — ABNORMAL HIGH (ref 70–99)
Glucose-Capillary: 170 mg/dL — ABNORMAL HIGH (ref 70–99)
Glucose-Capillary: 164 mg/dL — ABNORMAL HIGH (ref 70–99)
Glucose-Capillary: 147 mg/dL — ABNORMAL HIGH (ref 70–99)
Glucose-Capillary: 146 mg/dL — ABNORMAL HIGH (ref 70–99)
Glucose-Capillary: 143 mg/dL — ABNORMAL HIGH (ref 70–99)
Glucose-Capillary: 147 mg/dL — ABNORMAL HIGH (ref 70–99)
Glucose-Capillary: 159 mg/dL — ABNORMAL HIGH (ref 70–99)
Glucose-Capillary: 161 mg/dL — ABNORMAL HIGH (ref 70–99)
Glucose-Capillary: 164 mg/dL — ABNORMAL HIGH (ref 70–99)
Glucose-Capillary: 149 mg/dL — ABNORMAL HIGH (ref 70–99)
Glucose-Capillary: 135 mg/dL — ABNORMAL HIGH (ref 70–99)

## 2023-11-02 LAB — COMPREHENSIVE METABOLIC PANEL WITH GFR
ALT: 13 U/L (ref 0–44)
AST: 18 U/L (ref 15–41)
Albumin: 2.3 g/dL — ABNORMAL LOW (ref 3.5–5.0)
Alkaline Phosphatase: 61 U/L (ref 38–126)
Anion gap: 9 (ref 5–15)
BUN: 6 mg/dL (ref 6–20)
CO2: 19 mmol/L — ABNORMAL LOW (ref 22–32)
Calcium: 8.5 mg/dL — ABNORMAL LOW (ref 8.9–10.3)
Chloride: 107 mmol/L (ref 98–111)
Creatinine, Ser: 0.31 mg/dL — ABNORMAL LOW (ref 0.44–1.00)
GFR, Estimated: >60 mL/min (ref >60)
Glucose, Bld: 188 mg/dL — ABNORMAL HIGH (ref 70–99)
Potassium: 3.4 mmol/L — ABNORMAL LOW (ref 3.5–5.1)
Sodium: 135 mmol/L (ref 135–145)
Total Bilirubin: 0.8 mg/dL (ref 0.0–1.2)
Total Protein: 5.6 g/dL — ABNORMAL LOW (ref 6.5–8.1)

## 2023-11-02 LAB — BASIC METABOLIC PANEL WITH GFR
Anion gap: 11 (ref 5–15)
BUN: 6 mg/dL (ref 6–20)
CO2: 14 mmol/L — ABNORMAL LOW (ref 22–32)
Calcium: 7.9 mg/dL — ABNORMAL LOW (ref 8.9–10.3)
Chloride: 105 mmol/L (ref 98–111)
Creatinine, Ser: 0.44 mg/dL (ref 0.44–1.00)
GFR, Estimated: >60 mL/min (ref >60)
Glucose, Bld: 254 mg/dL — ABNORMAL HIGH (ref 70–99)
Potassium: 5.4 mmol/L — ABNORMAL HIGH (ref 3.5–5.1)
Sodium: 130 mmol/L — ABNORMAL LOW (ref 135–145)

## 2023-11-02 LAB — CBC
HCT: 39.9 % (ref 36.0–46.0)
Hemoglobin: 14.2 g/dL (ref 12.0–15.0)
MCH: 29.5 pg (ref 26.0–34.0)
MCHC: 35.6 g/dL (ref 30.0–36.0)
MCV: 83 fL (ref 80.0–100.0)
Platelets: 216 10*3/uL (ref 150–400)
RBC: 4.81 MIL/uL (ref 3.87–5.11)
RDW: 12.7 % (ref 11.5–15.5)
WBC: 9.5 10*3/uL (ref 4.0–10.5)
nRBC: 0 % (ref 0.0–0.2)

## 2023-11-02 LAB — LIPASE, BLOOD: Lipase: 382 U/L — ABNORMAL HIGH (ref 11–51)

## 2023-11-02 LAB — LACTIC ACID, PLASMA
Lactic Acid, Venous: 1.1 mmol/L (ref 0.5–1.9)
Lactic Acid, Venous: 1.2 mmol/L (ref 0.5–1.9)

## 2023-11-02 LAB — HEMOGLOBIN A1C
Hgb A1c MFr Bld: 13.2 % — ABNORMAL HIGH (ref 4.8–5.6)
Mean Plasma Glucose: 332.14 mg/dL

## 2023-11-02 LAB — TRIGLYCERIDES
Triglycerides: 1139 mg/dL — ABNORMAL HIGH (ref <150)
Triglycerides: 477 mg/dL — ABNORMAL HIGH (ref <150)

## 2023-11-02 LAB — TSH: TSH: 1.268 u[IU]/mL (ref 0.350–4.500)

## 2023-11-02 MED ORDER — SODIUM CHLORIDE 0.9 % IV SOLN
1.0000 g | Freq: Three times a day (TID) | INTRAVENOUS | Status: DC
  Administered 2023-11-02 – 2023-11-04 (×5): 1 g via INTRAVENOUS
  Filled 2023-11-02 (×6): qty 20

## 2023-11-02 MED ORDER — METOPROLOL TARTRATE 5 MG/5ML IV SOLN
2.5000 mg | Freq: Once | INTRAVENOUS | Status: AC
  Administered 2023-11-02: 2.5 mg via INTRAVENOUS
  Filled 2023-11-02: qty 5

## 2023-11-02 MED ORDER — DEXTROSE IN LACTATED RINGERS 5 % IV SOLN: INTRAVENOUS | Status: DC

## 2023-11-02 MED ORDER — HYDROMORPHONE HCL 1 MG/ML IJ SOLN
1.0000 mg | INTRAMUSCULAR | Status: DC | PRN
  Administered 2023-11-02 – 2023-11-04 (×6): 1 mg via INTRAVENOUS
  Filled 2023-11-02 (×6): qty 1

## 2023-11-02 NOTE — Inpatient Diabetes Management (Addendum)
 Inpatient Diabetes Program Recommendations  AACE/ADA: New Consensus Statement on Inpatient Glycemic Control (2015)  Target Ranges:  Prepandial:   less than 140 mg/dL      Peak postprandial:   less than 180 mg/dL (1-2 hours)      Critically ill patients:  140 - 180 mg/dL   Lab Results  Component Value Date   GLUCAP 169 (H) 11/02/2023   HGBA1C 13.2 (H) 11/02/2023    Review of Glycemic Control  Latest Reference Range & Units 11/02/23 06:56 11/02/23 08:01 11/02/23 09:03 11/02/23 10:01 11/02/23 11:03 11/02/23 12:09  Glucose-Capillary 70 - 99 mg/dL 794 (H) 787 (H) 794 (H) 205 (H) 192 (H) 169 (H)   Diabetes history: DM 2 Outpatient Diabetes medications:  Glucotrol 10 mg daily, Metformin  1000 mg bid Current orders for Inpatient glycemic control:  IV insulin  for hypertriglyceridemia-induced pancreatitis  Inpatient Diabetes Program Recommendations:    A1C is 13.2% indicating probable need for insulin  when discharged from the hospital.  Will attempt to discuss with patient today.   **Note patient was discharged on insulin  in 2023 (70/30 12 units bid).  Unsure when this was discontinued.    Addendum 1515: Spoke with patient using bedside video translator [phone].  Discussed current A1C of 13.2% and uncontrolled DM/blood sugars.  She states that in 2023, she was trained to use insulin  pen and only used a few days.  She states that she felt dizzy after taking insulin  so she stopped taking it.  I discussed importance of glycemic control and correlation with triglycerides.  Notified patient that she would likely need insulin  at discharge.  May benefit from CGM at discharge as well?  Will continue to follow.    Thanks,  Randall Bullocks, RN, BC-ADM Inpatient Diabetes Coordinator Pager [phone]  (8a-5p)

## 2023-11-02 NOTE — Plan of Care (Signed)

## 2023-11-02 NOTE — Progress Notes (Signed)
 1935 Lab called BMP sample hemolyzed and D Dimer sample lipemic and will need to redraw both, Provider notified. 2030 Pt had HR Sinus Tachy cardia 150s Provider notified. Pt on Insulin  gtt 6.7 ml/hr, accu checks 250s to 280's will check q 1 hour.  D5L at 150 ml/hr, spanish speaking patient resting and plan to have CT scan to rule out PE, voices some torso abdomen, ribcage discomfort, will monitor.   2115 Cardizem 5 mg ivp given per order for Sinus Tachycardia 150s, prior pat up to void and had emesis ~ 25 ml clear fluids, able to tolerate some ice chips earlier.   2300 Given Dilaudid  1 mg IVP and Lpressor 5 mg slow IVP Stach 150s decreased to ST 120s, will monitor.  2315 Pt taken to CT for chest C T accompanied on monitor wit charge Nurse.  0030 Pt given NS 500 ml bolus for sinu tachycardia.   0215 Pt assisted to void and c/o abdominal pain and given Dilaudid  1 mg IVP pain 6/10.

## 2023-11-02 NOTE — Progress Notes (Signed)
 Pharmacy Antibiotic Note  Shelia Ford is a 33 y.o. female admitted on 11/01/2023 with acute pancreatitis initially due to elevated triglycerides > 5000, now with concerns for sepsis (febrile, elevated WBC, increasing fluid seen on imaging). Pharmacy has been consulted for meropenem dosing.  Plan: Meropenem 1g IV Q8h Trend WBC, fever, renal function F/u cultures, clinical progress  De-escalate when able   Height: 4' 6 (137.2 cm) Weight: 67.1 kg (148 lb) IBW/kg (Calculated) : 31.7  Temp (24hrs), Avg:98.7 F (37.1 C), Min:97.6 F (36.4 C), Max:100.6 F (38.1 C)  Recent Labs  Lab 11/01/23 0217 11/01/23 0438 11/01/23 0925 11/02/23 0001  WBC  --  15.0* 15.1*  --   CREATININE 0.60  --  0.65 0.44    Estimated Creatinine Clearance: 71.8 mL/min (by C-G formula based on SCr of 0.44 mg/dL).    No Known Allergies  Microbiology results: 6/25 BCx: ordered, to be collected  Thank you for allowing pharmacy to be a part of this patient's care.  Shelba Collier, PharmD, BCPS Clinical Pharmacist

## 2023-11-02 NOTE — Progress Notes (Signed)
 Meds/assessment done using interpreter [place] [phone]

## 2023-11-02 NOTE — Progress Notes (Signed)
 PROGRESS NOTE    Shelia Ford  FMW:[id number] DOB: Apr 06, 1991 DOA: 11/01/2023 PCP: Celestia Rosaline SQUIBB, NP   Chief Complaint  Patient presents with   Abdominal Pain    Brief Narrative:    Shelia Ford is a 33 y.o. 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   Pt is a native Spanish speake and pre-occupied with her abdominal pain at the time of the exam. From what I can gather per Epic and brief interview with patient, she had pain in her belly for the past 3 days for which she took tylenol  1g BID without relief; as such, she presented to the ED for ongoing care. She endorses a previous episode of pancreatitis 4 years ago, and denies alcohol use.   In the ED, pt febrile, hypertensive, tachycardic and tachypneic on RA. Labs notable for Na 120, Cr 0.6, lipase 989, triglycerides >5000, WBC 15, and glucose 316. CT abd showed acute pancreatitis. Pt admitted to medicine for ongoing care.    Assessment & Plan:   Principal Problem:   Acute pancreatitis Active Problems:   Acute pancreatitis due to systemic disease  Hypertriglyceridemia Acute pancreatitis - Patient with history of acute pancreatitis in the past.  For hyperlipidemia - Continue with insulin  drip hypertriglyceridemia protocol. - Lipase level significantly elevated, significantly inflamed pancreas on imaging with surrounding duodenum and stomach. - Continue with aggressive IV fluid hydration. - Remains with significant pain, nausea, will keep n.p.o., continue with IV fluids and as needed pain regimen, I have asked her if she is still in pain she can ask them so we can increase the frequency of IV Dilaudid . - She is febrile 100.6, significant leukocytosis as well, around pancreas appears to be increasing in size on repeat CTA chest (few hours last evening, will start on IV meropenem - Will consult GI.  Diabetes mellitus, type II,  poorly controlled with hyperglycemia Early DKA - Workup significant for elevated BHA, ketonuria, bicarb of 14 - She is currently insulin  drip, her CBG is controlled - A1c is pending  SIRS - Febrile, leukocytosis, tachycardic and tachypneic, this is most likely in the setting of her acute pancreatitis, will obtain blood cultures given fever 100.6, she will be started empirically on IV meropenem.  Hyponatremia - Improving with IV fluids  Hyperkalemia - As discussed with lab, this is most likely hemolyzed in the setting of her hyperlipemia   Hypertriglyceridemia - Resume statin, Zetia  and fenofibrate  - Likely she will benefit from a referral to lipid clinic.  Persistent tachycardia -Presumably related to above -CTA chest negative for PE  Transaminitis - She is mildly elevated, CT significant for hepatitis steatosis   DVT prophylaxis: Lovenox  Code Status: Full Family Communication: None at bedside, telemetry interpreter [phone] was used through entire interaction with the patient Disposition:   Status is: Inpatient    Consultants:  GI   Subjective:  She still complains of epigastric pain, nausea  Objective: Vitals:   11/02/23 0213 11/02/23 0300 11/02/23 0440 11/02/23 0730  BP: 120/78 113/74  109/78  Pulse: (!) 142 (!) 135  (!) 136  Resp: (!) 23 17  20   Temp:    99.2 F (37.3 C)  TempSrc:   Oral Oral  SpO2: 99% 98%  98%  Weight:      Height:        Intake/Output Summary (Last 24 hours) at 11/02/2023 0942 Last data filed at 11/01/2023 1513 Gross per 24 hour  Intake 1600 ml  Output --  Net 1600 ml   Filed Weights   11/01/23 0210  Weight: 67.1 kg    Examination:  Awake Alert, Oriented X 3, intermittently tearful Symmetrical Chest wall movement, diminished air entry at the bases Tachycardic,No Gallops,Rubs or new Murmurs, No Parasternal Heave Bowel sounds present, significant epigastric tenderness to palpation No Cyanosis, Clubbing or edema, No new Rash  or bruise       Data Reviewed: I have personally reviewed following labs and imaging studies  CBC: Recent Labs  Lab 11/01/23 0438 11/01/23 0925  WBC 15.0* 15.1*  HGB 15.0 15.4*  HCT 40.4 43.1  MCV 84.3 86.4  PLT 273 272    Basic Metabolic Panel: Recent Labs  Lab 11/01/23 0217 11/01/23 0925 11/02/23 0001  NA 120*  --  130*  K 3.5  --  5.4*  CL 89*  --  105  CO2 17*  --  14*  GLUCOSE 316*  --  254*  BUN 8  --  6  CREATININE 0.60 0.65 0.44  CALCIUM  8.4*  --  7.9*    GFR: Estimated Creatinine Clearance: 71.8 mL/min (by C-G formula based on SCr of 0.44 mg/dL).  Liver Function Tests: Recent Labs  Lab 11/01/23 0217  AST 43*  ALT 58*  ALKPHOS 81  BILITOT 0.4  PROT 7.3  ALBUMIN 3.7    CBG: Recent Labs  Lab 11/02/23 0436 11/02/23 0537 11/02/23 0656 11/02/23 0801 11/02/23 0903  GLUCAP 213* 202* 205* 212* 205*     No results found for this or any previous visit (from the past 240 hours).       Radiology Studies: CT Angio Chest Pulmonary Embolism (PE) W or WO Contrast Result Date: 11/01/2023 CLINICAL DATA:  Pulmonary embolism (PE) suspected, high prob. Chest pain, epigastric pain EXAM: CT ANGIOGRAPHY CHEST WITH CONTRAST TECHNIQUE: Multidetector CT imaging of the chest was performed using the standard protocol during bolus administration of intravenous contrast. Multiplanar CT image reconstructions and MIPs were obtained to evaluate the vascular anatomy. RADIATION DOSE REDUCTION: This exam was performed according to the departmental dose-optimization program which includes automated exposure control, adjustment of the mA and/or kV according to patient size and/or use of iterative reconstruction technique. CONTRAST:  75mL OMNIPAQUE  IOHEXOL  350 MG/ML SOLN COMPARISON:  None Available. FINDINGS: Cardiovascular: No filling defects in the pulmonary arteries to suggest pulmonary emboli. Heart is normal size. Aorta is normal caliber. Mediastinum/Nodes: No mediastinal,  hilar, or axillary adenopathy. Trachea and esophagus are unremarkable. Thyroid unremarkable. Lungs/Pleura: Ground-glass and linear airspace opacities in the lower lobes, favor atelectasis. No effusions. Upper Abdomen: Fluid noted adjacent to the stomach, colon and spleen has increased since earlier abdominal CT. This likely reflects worsening pancreatitis. Pancreas is not visualized on this chest CT. Musculoskeletal: No acute bony abnormality. Review of the MIP images confirms the above findings. IMPRESSION: No evidence of pulmonary embolus. Ground-glass and linear densities in the lower lobes, likely atelectasis. Increasing fluid seen in the upper abdomen adjacent to the stomach, spleen and colon concerning for worsening pancreatitis. Electronically Signed   By: Franky Crease M.D.   On: 11/01/2023 23:32   CT ABDOMEN PELVIS W CONTRAST Result Date: 11/01/2023 EXAM: CT ABDOMEN AND PELVIS WITH CONTRAST 11/01/2023 05:48:37 AM TECHNIQUE: CT of the abdomen and pelvis was performed with the administration of intravenous contrast. Multiplanar reformatted images are provided for review. Automated exposure control, iterative reconstruction, and/or weight based adjustment of the mA/kV was utilized to reduce the radiation dose to as  low as reasonably achievable. COMPARISON: CT of the abdomen and pelvis 01/07/2022. CLINICAL HISTORY: Pancreatitis, acute, severe. Pt complaining of nausea and vomiting since 4 pm. Has pain in the epigastric area. FINDINGS: LOWER CHEST: No acute abnormality. LIVER: Mild fatty infiltration of the liver is present. No discrete lesions are present. GALLBLADDER AND BILE DUCTS: Gallbladder is unremarkable. No biliary ductal dilatation. SPLEEN: No acute abnormality. PANCREAS: Diffuse inflammatory changes are present about the head and proximal body of the pancreas. The pancreas enhances throughout. No focal mass lesion or cyst formation is present. Secondary inflammatory changes surround the distal  stomach and duodenum. ADRENAL GLANDS: No acute abnormality. KIDNEYS, URETERS AND BLADDER: No stones in the kidneys or ureters. No hydronephrosis. No perinephric or periureteral stranding. Moderate distension of the urinary bladder is present. The bladder measures 15 cm cephalocaudad. GI AND BOWEL: Stomach demonstrates no acute abnormality. There is no bowel obstruction. No bowel wall thickening. PERITONEUM AND RETROPERITONEUM: No ascites. No free air. VASCULATURE: Aorta is normal in caliber. LYMPH NODES: No lymphadenopathy. REPRODUCTIVE ORGANS: No acute abnormality. BONES AND SOFT TISSUES: No acute osseous abnormality. No focal soft tissue abnormality. IMPRESSION: 1. Acute pancreatitis with diffuse inflammatory changes about the head and proximal body of the pancreas, as well as secondary inflammatory changes surrounding the distal stomach and duodenum. No focal mass lesion or cyst formation. 2. Mild hepatic steatosis without discrete lesions. 3. Moderate distension of the urinary bladder, measuring 15 cm cephalocaudad. Electronically signed by: Lonni Necessary MD 11/01/2023 06:22 AM EDT RP Workstation: [id number]        Scheduled Meds:  enoxaparin  (LOVENOX ) injection  40 mg Subcutaneous Q24H   Continuous Infusions:  dextrose  5% lactated ringers  150 mL/hr at 11/02/23 0230   insulin  0.1 Units/kg/hr (11/02/23 0046)     LOS: 1 day      Brayton Lye, MD [facility]   To contact the attending provider between 7A-7P or the covering provider during after hours 7P-7A, please log into the web site [email or web address] and access using universal [place] password for that web site. If you do not have the password, please call the hospital operator.  11/02/2023, 9:42 AM

## 2023-11-02 NOTE — Consult Note (Signed)
 Consultation Note   Referring Provider:  [facility] PCP: Celestia Rosaline SQUIBB, NP Primary Gastroenterologist: [place]       Reason for Consultation: Pancreatitis DOA: 11/01/2023         Hospital Day: 2   ASSESSMENT    33 year old non-English-speaking female with recurrent acute pancreatitis, likely secondary to hypertriglyceridemia in the setting of noncompliance with triglyceride medications as well as diabetes.  No evidence for gallbladder/biliary source of pancreatitis.  No alcohol use. CTAP demonstrating acute pancreatitis with diffuse inflammatory changes about the head and proximal body of the pancreas, as well as secondary inflammatory changes surrounding the distal stomach and duodenum. No focal mass lesion or cyst formation. Triglycerides greater than 5000 on admission, now down to 1100 with insulin   DM2 Takes metformin  at home  See PMH for additional history  Principal Problem:   Acute pancreatitis Active Problems:   Acute pancreatitis due to systemic disease     PLAN:   --Continue IV fluids at 150 mL an hour --Keep n.p.o. for now, she is still having abdominal pain --Triglycerides are coming down.  Spoke with her about the importance of medication compliance especially since this is her second episode of pancreatitis most likely related to elevated triglycerides -- Unable to make any recommendations about what kind of injectable vitamins she is getting from [place] since she is unable to tell me anything about those substances  HPI   [employer] ( telephone) used for consultation.   33 y.o. year old female with a medical history including but not limited to acute pancreatitis, severe hyper triglyceridemia, DM2  Brief history  Reviewed some of the notes in Care Everywhere .  Her triglycerides were 1400s in August 2023.  She was referred to Endocrinology but apparently didn't go. She was then  hospitalized here in late Augu 2023 with pancreatitis 2/2 to hypertriglyceridemia  Interval history: Patient presented to the ED yesterday with nausea and vomiting as well as epigastric pain. Labs notable for WBC 14K, , K+ 5.4, , sodium 120 , glucose 316 , lipase 989 , AST 43 , ALT 58 , normal bilirubin and normal alkaline phosphatase .  Normal renal function . Hct was 40%.  CT scan showed pancreatitis. She was started on IV antibiotics, fluid resuscitated (appears to have gotten 2 L bolus then 250 ml hr for a few hours ( though I+0 showing 1600 ml total for yesterday) CT AP with contrast Acute pancreatitis with diffuse inflammatory changes about the head and proximal body of the pancreas, as well as secondary inflammatory changes surrounding the distal stomach and duodenum. No focal mass lesion or cyst  Updates:  WBC has normalized.  Serum sodium has normalized.  Lipase 382, liver enzymes, alk phos and bilirubin remain normal.  Started on insulin ,  triglycerides down to 1139.  Reviewed home medications.  She is taking metformin , a statin, and supposed to be on something for her triglycerides but admittedly does not take it very often.  She takes multiple vitamin.  She injects vitamins from [facility]:  Recent Labs    11/01/23 0217  PROT 7.3  ALBUMIN 3.7  AST 43*  ALT 58*  ALKPHOS 81  BILITOT 0.4  Recent Labs    11/01/23 0438 11/01/23 0925 11/02/23 1059  WBC 15.0* 15.1* 9.5  HGB 15.0 15.4* 14.2  HCT 40.4 43.1 39.9  MCV 84.3 86.4 83.0  PLT 273 272 216   Recent Labs    11/01/23 0217 11/01/23 0925 11/02/23 0001  NA 120*  --  130*  K 3.5  --  5.4*  CL 89*  --  105  CO2 17*  --  14*  GLUCOSE 316*  --  254*  BUN 8  --  6  CREATININE 0.60 0.65 0.44  CALCIUM  8.4*  --  7.9*     CT Angio Chest Pulmonary Embolism (PE) W or WO Contrast CLINICAL DATA:  Pulmonary embolism (PE) suspected, high prob. Chest pain, epigastric pain  EXAM: CT ANGIOGRAPHY CHEST  WITH CONTRAST  TECHNIQUE: Multidetector CT imaging of the chest was performed using the standard protocol during bolus administration of intravenous contrast. Multiplanar CT image reconstructions and MIPs were obtained to evaluate the vascular anatomy.  RADIATION DOSE REDUCTION: This exam was performed according to the departmental dose-optimization program which includes automated exposure control, adjustment of the mA and/or kV according to patient size and/or use of iterative reconstruction technique.  CONTRAST:  75mL OMNIPAQUE  IOHEXOL  350 MG/ML SOLN  COMPARISON:  None Available.  FINDINGS: Cardiovascular: No filling defects in the pulmonary arteries to suggest pulmonary emboli. Heart is normal size. Aorta is normal caliber.  Mediastinum/Nodes: No mediastinal, hilar, or axillary adenopathy. Trachea and esophagus are unremarkable. Thyroid unremarkable.  Lungs/Pleura: Ground-glass and linear airspace opacities in the lower lobes, favor atelectasis. No effusions.  Upper Abdomen: Fluid noted adjacent to the stomach, colon and spleen has increased since earlier abdominal CT. This likely reflects worsening pancreatitis. Pancreas is not visualized on this chest CT.  Musculoskeletal: No acute bony abnormality.  Review of the MIP images confirms the above findings.  IMPRESSION: No evidence of pulmonary embolus.  Ground-glass and linear densities in the lower lobes, likely atelectasis.  Increasing fluid seen in the upper abdomen adjacent to the stomach, spleen and colon concerning for worsening pancreatitis.  Electronically Signed   By: Franky Crease M.D.   On: 11/01/2023 23:32 CT ABDOMEN PELVIS W CONTRAST EXAM: CT ABDOMEN AND PELVIS WITH CONTRAST 11/01/2023 05:48:37 AM  TECHNIQUE: CT of the abdomen and pelvis was performed with the administration of intravenous contrast. Multiplanar reformatted images are provided for review. Automated exposure control, iterative  reconstruction, and/or weight based adjustment of the mA/kV was utilized to reduce the radiation dose to as low as reasonably achievable.  COMPARISON: CT of the abdomen and pelvis 01/07/2022.  CLINICAL HISTORY: Pancreatitis, acute, severe. Pt complaining of nausea and vomiting since 4 pm. Has pain in the epigastric area.  FINDINGS:  LOWER CHEST: No acute abnormality.  LIVER: Mild fatty infiltration of the liver is present. No discrete lesions are present.  GALLBLADDER AND BILE DUCTS: Gallbladder is unremarkable. No biliary ductal dilatation.  SPLEEN: No acute abnormality.  PANCREAS: Diffuse inflammatory changes are present about the head and proximal body of the pancreas. The pancreas enhances throughout. No focal mass lesion or cyst formation is present. Secondary inflammatory changes surround the distal stomach and duodenum.  ADRENAL GLANDS: No acute abnormality.  KIDNEYS, URETERS AND BLADDER: No stones in the kidneys or ureters. No hydronephrosis. No perinephric or periureteral stranding. Moderate distension of the urinary bladder is present. The bladder measures 15 cm cephalocaudad.  GI AND BOWEL: Stomach demonstrates no acute abnormality. There is no bowel obstruction. No bowel wall thickening.  PERITONEUM AND RETROPERITONEUM: No ascites. No free air.  VASCULATURE: Aorta is normal in caliber.  LYMPH NODES: No lymphadenopathy.  REPRODUCTIVE ORGANS: No acute abnormality.  BONES AND SOFT TISSUES: No acute osseous abnormality. No focal soft tissue abnormality.  IMPRESSION: 1. Acute pancreatitis with diffuse inflammatory changes about the head and proximal body of the pancreas, as well as secondary inflammatory changes surrounding the distal stomach and duodenum. No focal mass lesion or cyst formation. 2. Mild hepatic steatosis without discrete lesions. 3. Moderate distension of the urinary bladder, measuring 15 cm cephalocaudad.  Electronically  signed by: Lonni Necessary MD 11/01/2023 06:22 AM EDT RP Workstation: [id number]    Past Medical History:  Diagnosis Date   Diabetes mellitus without complication (HCC)    History of 3 cesarean sections 02/08/2014   Needs repeat   Hyperlipidemia    Hypertriglyceridemia    Medical history non-contributory    Postprocedural pelvic peritoneal adhesions    4 prior c/s    Past Surgical History:  Procedure Laterality Date   CESAREAN SECTION     C/S x 4--low vertical T extension with 4th.   CESAREAN SECTION N/A 03/22/2014   Procedure: CESAREAN SECTION;  Surgeon: Norleen Edsel GAILS, MD;  Location: [facility];  Service: Obstetrics;  Laterality: N/A;    Family History  Problem Relation Age of Onset   Diabetes Mother    Diabetes Maternal Aunt     Prior to Admission medications   Medication Sig Start Date End Date Taking? Authorizing Provider  acetaminophen  (TYLENOL  8 HOUR) 650 MG CR tablet Take 1 tablet (650 mg total) by mouth every 8 (eight) hours as needed for pain. 10/21/18  Yes Petrucelli, Samantha R, PA-C  atorvastatin  (LIPITOR ) 40 MG tablet Take 40 mg by mouth daily. for cholesterol. 10/31/23  Yes [provider]  fenofibrate  (TRICOR ) 145 MG tablet Take 145 mg by mouth daily. for cholesterol. 10/31/23  Yes [provider]  glipiZIDE (GLUCOTROL) 10 MG tablet Take 10 mg by mouth 2 (two) times daily before a meal. 10/31/23  Yes [provider]  metFORMIN  (GLUCOPHAGE ) 1000 MG tablet Take 1,000 mg by mouth 2 (two) times daily with a meal. 10/31/23  Yes [provider]    Current Facility-Administered Medications  Medication Dose Route Frequency Provider Last Rate Last Admin   acetaminophen  (TYLENOL ) tablet 650 mg  650 mg Oral Q6H PRN Moore, Willie, MD   650 mg at 11/02/23 1212   dextrose  5 % in lactated ringers  infusion   Intravenous Continuous Elgergawy, Dawood S, MD 150 mL/hr at 11/02/23 1046 New Bag at 11/02/23 1046   enoxaparin  (LOVENOX ) injection 40  mg  40 mg Subcutaneous Q24H Georgina Basket, MD   40 mg at 11/01/23 1548   HYDROmorphone  (DILAUDID ) injection 1 mg  1 mg Intravenous Q2H PRN Elgergawy, Dawood S, MD       insulin  (MYXREDLIN ) 100 units/100 mL infusion for hypertriglyceridemia-induced pancreatitis  0.1 Units/kg/hr Intravenous Continuous Georgina Basket, MD 6.71 mL/hr at 11/02/23 0046 0.1 Units/kg/hr at 11/02/23 0046   meropenem (MERREM) 1 g in sodium chloride  0.9 % 100 mL IVPB  1 g Intravenous Q8H Elgergawy, Dawood S, MD 200 mL/hr at 11/02/23 1203 1 g at 11/02/23 1203   ondansetron  (ZOFRAN ) injection 4 mg  4 mg Intravenous Q6H PRN Moore, Willie, MD   4 mg at 11/01/23 2228    Allergies as of 11/01/2023   (No Known Allergies)    Social History   Socioeconomic History   Marital status: Single  Spouse name: Not on file   Number of children: 4   Years of education: Not on file   Highest education level: 3rd grade  Occupational History   Not on file  Tobacco Use   Smoking status: Never   Smokeless tobacco: Never  Vaping Use   Vaping status: Never Used  Substance and Sexual Activity   Alcohol use: No   Drug use: No   Sexual activity: Not Currently    Birth control/protection: Injection  Other Topics Concern   Not on file  Social History Narrative   ** Merged History Encounter **       Social Drivers of Health   Financial Resource Strain: Not on File (12/01/2021)   Received from [employer]    Financial Resource Strain: 0  Food Insecurity: No Food Insecurity (11/01/2023)   Hunger Vital Sign    Worried About Running Out of Food in the Last Year: Never true    Ran Out of Food in the Last Year: Never true  Transportation Needs: No Transportation Needs (11/01/2023)   PRAPARE - Administrator, Civil Service (Medical): No    Lack of Transportation (Non-Medical): No  Physical Activity: Not on File (12/01/2021)   Received from [facility]   Physical Activity    Physical Activity: 0  Stress:  Not on File (12/01/2021)   Received from [facility]   Stress    Stress: 0  Social Connections: Not on File (01/12/2023)   Received from [facility]   Social Connections    Connectedness: 0  Intimate Partner Violence: Not At Risk (11/01/2023)   Humiliation, Afraid, Rape, and Kick questionnaire    Fear of Current or Ex-Partner: No    Emotionally Abused: No    Physically Abused: No    Sexually Abused: No     Code Status   Code Status: Full Code  Review of Systems: All systems reviewed and negative except where noted in HPI.  Physical Exam: Vital signs in last 24 hours: Temp:  [97.6 F (36.4 C)-100.1 F (37.8 C)] 100.1 F (37.8 C) (06/25 1200) Pulse Rate:  [118-154] 135 (06/25 1200) Resp:  [17-24] 20 (06/25 1200) BP: (103-150)/(67-106) 112/78 (06/25 1200) SpO2:  [97 %-100 %] 97 % (06/25 1200) Last BM Date : 10/31/23  General:  Pleasant female in NAD Psych:  Cooperative. Normal mood and affect Eyes: Pupils equal Ears:  Normal auditory acuity Nose: No deformity, discharge or lesions Neck:  Supple, no masses felt Lungs:  Clear to auscultation.  Heart:  Regular rate, regular rhythm.  Abdomen:  Soft, nondistended, moderately severe generalized upper abdominal tenderness , a few bowel sounds, no masses felt Rectal :  Deferred Msk: Symmetrical without gross deformities.  Neurologic:  Alert, oriented, grossly normal neurologically Extremities : No edema Skin:  Intact without significant lesions.    Intake/Output from previous day: 06/24 0701 - 06/25 0700 In: 1600 [I.V.:600; IV Piggyback:1000] Out: -  Intake/Output this shift:  No intake/output data recorded.   Vina Dasen, NP-C   11/02/2023, 12:56 PM

## 2023-11-02 NOTE — Plan of Care (Signed)
  Problem: Education: Goal: Knowledge of General Education information will improve Description: Including pain rating scale, medication(s)/side effects and non-pharmacologic comfort measures Outcome: Progressing   Problem: Health Behavior/Discharge Planning: Goal: Ability to manage health-related needs will improve Outcome: Progressing   Problem: Clinical Measurements: Goal: Ability to maintain clinical measurements within normal limits will improve Outcome: Progressing Goal: Will remain free from infection Outcome: Progressing Goal: Diagnostic test results will improve Outcome: Progressing Goal: Respiratory complications will improve Outcome: Progressing Goal: Cardiovascular complication will be avoided Outcome: Progressing   Problem: Activity: Goal: Risk for activity intolerance will decrease Outcome: Progressing   Problem: Nutrition: Goal: Adequate nutrition will be maintained Outcome: Progressing   Problem: Coping: Goal: Level of anxiety will decrease Outcome: Progressing   Problem: Elimination: Goal: Will not experience complications related to bowel motility Outcome: Progressing Goal: Will not experience complications related to urinary retention Outcome: Progressing   Problem: Pain Managment: Goal: General experience of comfort will improve and/or be controlled Outcome: Progressing   Problem: Safety: Goal: Ability to remain free from injury will improve Outcome: Progressing   Problem: Skin Integrity: Goal: Risk for impaired skin integrity will decrease Outcome: Progressing   Problem: Education: Goal: Ability to describe self-care measures that may prevent or decrease complications (Diabetes Survival Skills Education) will improve Outcome: Progressing Goal: Individualized Educational Video(s) Outcome: Progressing   Problem: Coping: Goal: Ability to adjust to condition or change in health will improve Outcome: Progressing   Problem: Fluid  Volume: Goal: Ability to maintain a balanced intake and output will improve Outcome: Progressing   Problem: Health Behavior/Discharge Planning: Goal: Ability to identify and utilize available resources and services will improve Outcome: Progressing Goal: Ability to manage health-related needs will improve Outcome: Progressing   Problem: Metabolic: Goal: Ability to maintain appropriate glucose levels will improve Outcome: Progressing   Problem: Nutritional: Goal: Maintenance of adequate nutrition will improve Outcome: Progressing Goal: Progress toward achieving an optimal weight will improve Outcome: Progressing   Problem: Skin Integrity: Goal: Risk for impaired skin integrity will decrease Outcome: Progressing   Problem: Tissue Perfusion: Goal: Adequacy of tissue perfusion will improve Outcome: Progressing 0445 Pt given Lopressor 2.5 mg slow IVP per order SBP 107, ST 130 -138, after medication HR Stach to 120s, pt assisted to bathroom and voided and assisted back to bed and monitor attached, pt report still having mid bilateral ribcage pain, 6-7/10 will give pain medication.

## 2023-11-03 ENCOUNTER — Other Ambulatory Visit (HOSPITAL_COMMUNITY): Payer: Self-pay

## 2023-11-03 DIAGNOSIS — K5900 Constipation, unspecified: Secondary | ICD-10-CM

## 2023-11-03 DIAGNOSIS — E876 Hypokalemia: Secondary | ICD-10-CM

## 2023-11-03 LAB — BASIC METABOLIC PANEL WITH GFR
Anion gap: 6 (ref 5–15)
Anion gap: 8 (ref 5–15)
BUN: <5 mg/dL — ABNORMAL LOW (ref 6–20)
BUN: <5 mg/dL — ABNORMAL LOW (ref 6–20)
CO2: 23 mmol/L (ref 22–32)
CO2: 21 mmol/L — ABNORMAL LOW (ref 22–32)
Calcium: 7.8 mg/dL — ABNORMAL LOW (ref 8.9–10.3)
Calcium: 7.8 mg/dL — ABNORMAL LOW (ref 8.9–10.3)
Chloride: 106 mmol/L (ref 98–111)
Chloride: 101 mmol/L (ref 98–111)
Creatinine, Ser: <0.3 mg/dL — ABNORMAL LOW (ref 0.44–1.00)
Creatinine, Ser: <0.3 mg/dL — ABNORMAL LOW (ref 0.44–1.00)
Glucose, Bld: 104 mg/dL — ABNORMAL HIGH (ref 70–99)
Glucose, Bld: 200 mg/dL — ABNORMAL HIGH (ref 70–99)
Potassium: 2.5 mmol/L — CL (ref 3.5–5.1)
Potassium: 3.7 mmol/L (ref 3.5–5.1)
Sodium: 135 mmol/L (ref 135–145)
Sodium: 130 mmol/L — ABNORMAL LOW (ref 135–145)

## 2023-11-03 LAB — CULTURE, BLOOD (ROUTINE X 2)
Culture: NO GROWTH
Culture: NO GROWTH

## 2023-11-03 LAB — PHOSPHORUS: Phosphorus: 1.8 mg/dL — ABNORMAL LOW (ref 2.5–4.6)

## 2023-11-03 LAB — GLUCOSE, CAPILLARY
Glucose-Capillary: 110 mg/dL — ABNORMAL HIGH (ref 70–99)
Glucose-Capillary: 113 mg/dL — ABNORMAL HIGH (ref 70–99)
Glucose-Capillary: 114 mg/dL — ABNORMAL HIGH (ref 70–99)
Glucose-Capillary: 115 mg/dL — ABNORMAL HIGH (ref 70–99)
Glucose-Capillary: 115 mg/dL — ABNORMAL HIGH (ref 70–99)
Glucose-Capillary: 118 mg/dL — ABNORMAL HIGH (ref 70–99)
Glucose-Capillary: 120 mg/dL — ABNORMAL HIGH (ref 70–99)
Glucose-Capillary: 127 mg/dL — ABNORMAL HIGH (ref 70–99)
Glucose-Capillary: 175 mg/dL — ABNORMAL HIGH (ref 70–99)
Glucose-Capillary: 187 mg/dL — ABNORMAL HIGH (ref 70–99)
Glucose-Capillary: 198 mg/dL — ABNORMAL HIGH (ref 70–99)
Glucose-Capillary: 77 mg/dL (ref 70–99)
Glucose-Capillary: 80 mg/dL (ref 70–99)
Glucose-Capillary: 80 mg/dL (ref 70–99)
Glucose-Capillary: 89 mg/dL (ref 70–99)

## 2023-11-03 LAB — MAGNESIUM: Magnesium: 1.6 mg/dL — ABNORMAL LOW (ref 1.7–2.4)

## 2023-11-03 LAB — CBC
HCT: 32.4 % — ABNORMAL LOW (ref 36.0–46.0)
Hemoglobin: 11.6 g/dL — ABNORMAL LOW (ref 12.0–15.0)
MCH: 29.8 pg (ref 26.0–34.0)
MCHC: 35.8 g/dL (ref 30.0–36.0)
MCV: 83.3 fL (ref 80.0–100.0)
Platelets: 184 10*3/uL (ref 150–400)
RBC: 3.89 MIL/uL (ref 3.87–5.11)
RDW: 12.8 % (ref 11.5–15.5)
WBC: 8.2 10*3/uL (ref 4.0–10.5)
nRBC: 0 % (ref 0.0–0.2)

## 2023-11-03 LAB — TRIGLYCERIDES: Triglycerides: 396 mg/dL — ABNORMAL HIGH (ref <150)

## 2023-11-03 MED ORDER — INSULIN GLARGINE-YFGN 100 UNIT/ML ~~LOC~~ SOLN
10.0000 [IU] | Freq: Every day | SUBCUTANEOUS | Status: DC
  Administered 2023-11-03: 10 [IU] via SUBCUTANEOUS
  Filled 2023-11-03: qty 0.1

## 2023-11-03 MED ORDER — INSULIN ASPART PROT & ASPART (70-30 MIX) 100 UNIT/ML ~~LOC~~ SUSP
6.0000 [IU] | Freq: Two times a day (BID) | SUBCUTANEOUS | Status: DC
  Administered 2023-11-04 – 2023-11-05 (×3): 6 [IU] via SUBCUTANEOUS
  Filled 2023-11-03: qty 10

## 2023-11-03 MED ORDER — LIVING WELL WITH DIABETES BOOK - IN SPANISH
Freq: Once | Status: AC
  Filled 2023-11-03: qty 1

## 2023-11-03 MED ORDER — POTASSIUM PHOSPHATES 15 MMOLE/5ML IV SOLN
30.0000 mmol | Freq: Once | INTRAVENOUS | Status: AC
  Administered 2023-11-03: 30 mmol via INTRAVENOUS
  Filled 2023-11-03: qty 10

## 2023-11-03 MED ORDER — POTASSIUM CHLORIDE 10 MEQ/100ML IV SOLN
10.0000 meq | INTRAVENOUS | Status: AC
  Administered 2023-11-03 (×2): 10 meq via INTRAVENOUS
  Filled 2023-11-03 (×2): qty 100

## 2023-11-03 MED ORDER — ATORVASTATIN CALCIUM 40 MG PO TABS
40.0000 mg | ORAL_TABLET | Freq: Every day | ORAL | Status: DC
  Administered 2023-11-03 – 2023-11-05 (×3): 40 mg via ORAL
  Filled 2023-11-03 (×3): qty 1

## 2023-11-03 MED ORDER — INSULIN ASPART 100 UNIT/ML IJ SOLN: 0.0000 [IU] | Freq: Every day | INTRAMUSCULAR | Status: DC

## 2023-11-03 MED ORDER — LACTATED RINGERS IV SOLN: INTRAVENOUS | Status: AC

## 2023-11-03 MED ORDER — POLYETHYLENE GLYCOL 3350 17 G PO PACK
17.0000 g | PACK | Freq: Every day | ORAL | Status: DC
  Administered 2023-11-04 – 2023-11-05 (×2): 17 g via ORAL
  Filled 2023-11-03 (×2): qty 1

## 2023-11-03 MED ORDER — POTASSIUM CHLORIDE CRYS ER 20 MEQ PO TBCR
60.0000 meq | EXTENDED_RELEASE_TABLET | Freq: Four times a day (QID) | ORAL | Status: AC
  Administered 2023-11-03 (×2): 60 meq via ORAL
  Filled 2023-11-03 (×2): qty 3

## 2023-11-03 MED ORDER — MAGNESIUM SULFATE 4 GM/100ML IV SOLN
4.0000 g | Freq: Once | INTRAVENOUS | Status: AC
  Administered 2023-11-03: 4 g via INTRAVENOUS
  Filled 2023-11-03: qty 100

## 2023-11-03 MED ORDER — FENOFIBRATE 160 MG PO TABS
160.0000 mg | ORAL_TABLET | Freq: Every day | ORAL | Status: DC
  Administered 2023-11-03 – 2023-11-05 (×3): 160 mg via ORAL
  Filled 2023-11-03 (×3): qty 1

## 2023-11-03 MED ORDER — INSULIN ASPART 100 UNIT/ML IJ SOLN
0.0000 [IU] | Freq: Three times a day (TID) | INTRAMUSCULAR | Status: DC
  Administered 2023-11-03 – 2023-11-04 (×2): 3 [IU] via SUBCUTANEOUS
  Administered 2023-11-04: 2 [IU] via SUBCUTANEOUS
  Administered 2023-11-04: 5 [IU] via SUBCUTANEOUS
  Administered 2023-11-05: 3 [IU] via SUBCUTANEOUS
  Administered 2023-11-05: 5 [IU] via SUBCUTANEOUS

## 2023-11-03 MED ORDER — POTASSIUM CHLORIDE 10 MEQ/100ML IV SOLN
10.0000 meq | INTRAVENOUS | Status: DC
  Administered 2023-11-03 (×2): 10 meq via INTRAVENOUS
  Filled 2023-11-03 (×2): qty 100

## 2023-11-03 NOTE — Inpatient Diabetes Management (Signed)
 Inpatient Diabetes Program Recommendations  AACE/ADA: New Consensus Statement on Inpatient Glycemic Control (2015)  Target Ranges:  Prepandial:   less than 140 mg/dL      Peak postprandial:   less than 180 mg/dL (1-2 hours)      Critically ill patients:  140 - 180 mg/dL   Lab Results  Component Value Date   GLUCAP 118 (H) 11/03/2023   HGBA1C 13.2 (H) 11/02/2023    Review of Glycemic Control  Latest Reference Range & Units 11/03/23 06:18 11/03/23 07:30 11/03/23 07:56 11/03/23 09:02 11/03/23 10:05 11/03/23 12:01  Glucose-Capillary 70 - 99 mg/dL 886 (H) 89 80 80 77 881 (H)   Diabetes history: DM 2 Outpatient Diabetes medications:  Glucotrol 10 mg daily, Metformin  1000 mg bid Current orders for Inpatient glycemic control:  Novolog  70/30 6 units bid Novolog  0-15 units tid with meals and HS Inpatient Diabetes Program Recommendations:    Review of chart indicates that patient was restarted on insulin  in December of 2024. At January visit it had not been restarted yet so education given again on how to use insulin  pen. It appears that she was not taking insulin  at return visit in May or June of 2025. Asked patient yesterday regarding insulin  use and she states that she stopped taking due to dizziness.  A1C indicates need for insulin  at discharge so I explained.  I have scheduled in-person interpreter for tomorrow morning at 9:30am so that I can re-review use of insulin  pen and see if I can explore potential barriers.  Agree with current orders.   Thanks  Randall Bullocks, RN, BC-ADM Inpatient Diabetes Coordinator Pager [phone]  (8a-5p)

## 2023-11-03 NOTE — Plan of Care (Signed)
  Problem: Education: Goal: Knowledge of General Education information will improve Description: Including pain rating scale, medication(s)/side effects and non-pharmacologic comfort measures Outcome: Progressing   Problem: Health Behavior/Discharge Planning: Goal: Ability to manage health-related needs will improve Outcome: Progressing   Problem: Clinical Measurements: Goal: Ability to maintain clinical measurements within normal limits will improve Outcome: Progressing Goal: Will remain free from infection Outcome: Progressing Goal: Diagnostic test results will improve Outcome: Progressing Goal: Respiratory complications will improve Outcome: Progressing Goal: Cardiovascular complication will be avoided Outcome: Progressing   Problem: Activity: Goal: Risk for activity intolerance will decrease Outcome: Progressing   Problem: Nutrition: Goal: Adequate nutrition will be maintained Outcome: Progressing   Problem: Coping: Goal: Level of anxiety will decrease Outcome: Progressing   Problem: Elimination: Goal: Will not experience complications related to bowel motility Outcome: Progressing Goal: Will not experience complications related to urinary retention Outcome: Progressing   Problem: Pain Managment: Goal: General experience of comfort will improve and/or be controlled Outcome: Progressing   Problem: Safety: Goal: Ability to remain free from injury will improve Outcome: Progressing   Problem: Skin Integrity: Goal: Risk for impaired skin integrity will decrease Outcome: Progressing   Problem: Education: Goal: Ability to describe self-care measures that may prevent or decrease complications (Diabetes Survival Skills Education) will improve Outcome: Progressing Goal: Individualized Educational Video(s) Outcome: Progressing   Problem: Coping: Goal: Ability to adjust to condition or change in health will improve Outcome: Progressing   Problem: Fluid  Volume: Goal: Ability to maintain a balanced intake and output will improve Outcome: Progressing   Problem: Health Behavior/Discharge Planning: Goal: Ability to identify and utilize available resources and services will improve Outcome: Progressing Goal: Ability to manage health-related needs will improve Outcome: Progressing   Problem: Metabolic: Goal: Ability to maintain appropriate glucose levels will improve Outcome: Progressing   Problem: Nutritional: Goal: Maintenance of adequate nutrition will improve Outcome: Progressing Goal: Progress toward achieving an optimal weight will improve Outcome: Progressing   Problem: Skin Integrity: Goal: Risk for impaired skin integrity will decrease Outcome: Progressing   Problem: Tissue Perfusion: Goal: Adequacy of tissue perfusion will improve Outcome: Progressing  0700 Pt pain controlled upper extremity dependent edema, pt Insulin  gtt at 6.7 ml/hr glucose levels 110s, pt resting Stach low 100s, to 120s, am lab drawn.

## 2023-11-03 NOTE — Progress Notes (Signed)
 Daily Progress Note  DOA: 11/01/2023 Hospital Day: 3   Cc:  acute recurrent pancreatitis   ASSESSMENT    Spanish video interpreter used for visit  33 year old non-English-speaking female with recurrent acute pancreatitis, likely secondary to hypertriglyceridemia in the setting of noncompliance with triglyceride medications as well as diabetes.  No evidence for gallbladder/biliary source of pancreatitis.  No alcohol use. Triglycerides greater than 5000 on admission TODAY: Trig down to 396 with insulin . Insulin  drip being discontinued. Fenofibrate  started.   Hypokalemia Potassium repletion in progress   DM2 Takes metformin  at home  Hypertriglyceridemia Not compliant with prescribed medication at home  Constipation  Principal Problem:   Acute pancreatitis Active Problems:   Acute pancreatitis due to systemic disease   Hyponatremia   Leukocytosis   PLAN   -Discussed importance of taking prescribed med for triglycerides -Will try low fat diet -Daily miralax  for constipaton    Subjective   She looks and feels much better today   Objective   GI Studies:    Recent Labs    11/01/23 0925 11/02/23 1059 11/03/23 0607  WBC 15.1* 9.5 8.2  HGB 15.4* 14.2 11.6*  HCT 43.1 39.9 32.4*  MCV 86.4 83.0 83.3  PLT 272 216 184   No results for input(s): FOLATE, VITAMINB12, FERRITIN, TIBC, IRONPCTSAT in the last 72 hours. Recent Labs    11/02/23 0001 11/02/23 1059 11/03/23 0607  NA 130* 135 135  K 5.4* 3.4* 2.5*  CL 105 107 106  CO2 14* 19* 23  GLUCOSE 254* 188* 104*  BUN 6 6 <5*  CREATININE 0.44 0.31* <0.30*  CALCIUM  7.9* 8.5* 7.8*   Recent Labs    11/01/23 0217 11/02/23 1059  PROT 7.3 5.6*  ALBUMIN 3.7 2.3*  AST 43* 18  ALT 58* 13  ALKPHOS 81 61  BILITOT 0.4 0.8      Imaging:  CT Angio Chest Pulmonary Embolism (PE) W or WO Contrast CLINICAL DATA:  Pulmonary embolism (PE) suspected, high prob. Chest pain, epigastric  pain  EXAM: CT ANGIOGRAPHY CHEST WITH CONTRAST  TECHNIQUE: Multidetector CT imaging of the chest was performed using the standard protocol during bolus administration of intravenous contrast. Multiplanar CT image reconstructions and MIPs were obtained to evaluate the vascular anatomy.  RADIATION DOSE REDUCTION: This exam was performed according to the departmental dose-optimization program which includes automated exposure control, adjustment of the mA and/or kV according to patient size and/or use of iterative reconstruction technique.  CONTRAST:  75mL OMNIPAQUE  IOHEXOL  350 MG/ML SOLN  COMPARISON:  None Available.  FINDINGS: Cardiovascular: No filling defects in the pulmonary arteries to suggest pulmonary emboli. Heart is normal size. Aorta is normal caliber.  Mediastinum/Nodes: No mediastinal, hilar, or axillary adenopathy. Trachea and esophagus are unremarkable. Thyroid unremarkable.  Lungs/Pleura: Ground-glass and linear airspace opacities in the lower lobes, favor atelectasis. No effusions.  Upper Abdomen: Fluid noted adjacent to the stomach, colon and spleen has increased since earlier abdominal CT. This likely reflects worsening pancreatitis. Pancreas is not visualized on this chest CT.  Musculoskeletal: No acute bony abnormality.  Review of the MIP images confirms the above findings.  IMPRESSION: No evidence of pulmonary embolus.  Ground-glass and linear densities in the lower lobes, likely atelectasis.  Increasing fluid seen in the upper abdomen adjacent to the stomach, spleen and colon concerning for worsening pancreatitis.  Electronically Signed   By: Franky Crease M.D.   On: 11/01/2023 23:32 CT ABDOMEN PELVIS W CONTRAST EXAM: CT ABDOMEN AND PELVIS WITH CONTRAST  11/01/2023 05:48:37 AM  TECHNIQUE: CT of the abdomen and pelvis was performed with the administration of intravenous contrast. Multiplanar reformatted images are provided for  review. Automated exposure control, iterative reconstruction, and/or weight based adjustment of the mA/kV was utilized to reduce the radiation dose to as low as reasonably achievable.  COMPARISON: CT of the abdomen and pelvis 01/07/2022.  CLINICAL HISTORY: Pancreatitis, acute, severe. Pt complaining of nausea and vomiting since 4 pm. Has pain in the epigastric area.  FINDINGS:  LOWER CHEST: No acute abnormality.  LIVER: Mild fatty infiltration of the liver is present. No discrete lesions are present.  GALLBLADDER AND BILE DUCTS: Gallbladder is unremarkable. No biliary ductal dilatation.  SPLEEN: No acute abnormality.  PANCREAS: Diffuse inflammatory changes are present about the head and proximal body of the pancreas. The pancreas enhances throughout. No focal mass lesion or cyst formation is present. Secondary inflammatory changes surround the distal stomach and duodenum.  ADRENAL GLANDS: No acute abnormality.  KIDNEYS, URETERS AND BLADDER: No stones in the kidneys or ureters. No hydronephrosis. No perinephric or periureteral stranding. Moderate distension of the urinary bladder is present. The bladder measures 15 cm cephalocaudad.  GI AND BOWEL: Stomach demonstrates no acute abnormality. There is no bowel obstruction. No bowel wall thickening.  PERITONEUM AND RETROPERITONEUM: No ascites. No free air.  VASCULATURE: Aorta is normal in caliber.  LYMPH NODES: No lymphadenopathy.  REPRODUCTIVE ORGANS: No acute abnormality.  BONES AND SOFT TISSUES: No acute osseous abnormality. No focal soft tissue abnormality.  IMPRESSION: 1. Acute pancreatitis with diffuse inflammatory changes about the head and proximal body of the pancreas, as well as secondary inflammatory changes surrounding the distal stomach and duodenum. No focal mass lesion or cyst formation. 2. Mild hepatic steatosis without discrete lesions. 3. Moderate distension of the urinary bladder,  measuring 15 cm cephalocaudad.  Electronically signed by: Lonni Necessary MD 11/01/2023 06:22 AM EDT RP Workstation: [id number]     Scheduled inpatient medications:   atorvastatin   40 mg Oral Daily   enoxaparin  (LOVENOX ) injection  40 mg Subcutaneous Q24H   fenofibrate   160 mg Oral Daily   insulin  aspart  0-15 Units Subcutaneous TID WC   insulin  aspart  0-5 Units Subcutaneous QHS   insulin  glargine-yfgn  10 Units Subcutaneous Daily   potassium chloride   60 mEq Oral Q6H   Continuous inpatient infusions:   lactated ringers      magnesium  sulfate bolus IVPB     meropenem (MERREM) IV 200 mL/hr at 11/03/23 0540   potassium chloride  10 mEq (11/03/23 1004)   potassium PHOSPHATE IVPB (in mmol)     PRN inpatient medications: acetaminophen , HYDROmorphone  (DILAUDID ) injection, ondansetron  (ZOFRAN ) IV  Vital signs in last 24 hours: Temp:  [97.6 F (36.4 C)-100.1 F (37.8 C)] 98.5 F (36.9 C) (06/26 0753) Pulse Rate:  [104-135] 104 (06/26 0753) Resp:  [16-23] 20 (06/26 0753) BP: (101-119)/(65-82) 104/65 (06/26 0753) SpO2:  [94 %-99 %] 94 % (06/26 0753) Last BM Date : 10/31/23  Intake/Output Summary (Last 24 hours) at 11/03/2023 1122 Last data filed at 11/03/2023 0100 Gross per 24 hour  Intake --  Output 200 ml  Net -200 ml    Intake/Output from previous day: 06/25 0701 - 06/26 0700 In: -  Out: 250 [Urine:250] Intake/Output this shift: No intake/output data recorded.   Physical Exam:  General: Alert female in NAD Heart:  Regular rate and rhythm.  Pulmonary: Normal respiratory effort Abdomen: Soft, nondistended, mild generalized tenderness. Normal bowel sounds. Extremities: No lower extremity edema  Neurologic: Alert and oriented Psych: Pleasant. Cooperative     LOS: 2 days   Vina Dasen ,NP 11/03/2023, 11:22 AM

## 2023-11-03 NOTE — Plan of Care (Signed)

## 2023-11-03 NOTE — Progress Notes (Addendum)
 PROGRESS NOTE    Shelia Ford  FMW:[id number] DOB: [birth year] DOA: 11/01/2023 PCP: Shelia Rosaline SQUIBB, NP   Chief Complaint  Patient presents with   Abdominal Pain    Brief Narrative:    Shelia Ford is a 33 y.o. 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   Pt is a native Spanish speake and pre-occupied with her abdominal pain at the time of the exam. From what I can gather per Epic and brief interview with patient, she had pain in her belly for the past 3 days for which she took tylenol  1g BID without relief; as such, she presented to the ED for ongoing care. She endorses a previous episode of pancreatitis 4 years ago, and denies alcohol use.   In the ED, pt febrile, hypertensive, tachycardic and tachypneic on RA. Labs notable for Na 120, Cr 0.6, lipase 989, triglycerides >5000, WBC 15, and glucose 316. CT abd showed acute pancreatitis. Pt admitted to medicine for ongoing care.    Assessment & Plan:   Principal Problem:   Acute pancreatitis Active Problems:   Acute pancreatitis due to systemic disease   Hyponatremia   Leukocytosis  Hypertriglyceridemia Acute pancreatitis - Patient with history of acute pancreatitis in the past.  For hyperlipidemia - Lipase level significantly elevated, significantly inflamed pancreas on imaging with surrounding duodenum and stomach. - Continue with aggressive IV fluid hydration. -Continue with as needed pain and nausea medications. -Reports she is feeling better today, will start on clear liquid diet. -Empirically on IV meropenem given leukocytosis and fever 100.6, but appears much improved today, will DC antibiotics in 24 hours if continues to improve. -GI input greatly appreciated. - On insulin  drip and IV fluids regarding hypertriglyceridemia, much improved, will resume statin, Zetia  and fenofibrate  for hypertriglyceridemia  Diabetes mellitus,  type II, poorly controlled with hyperglycemia Early DKA - Workup significant for elevated BHA, ketonuria, bicarb of 14 - Insulin  drip initially - A1c is currently elevated at 13.2 - Will need to discharge on insulin , started on Semglee 10 units, likely will need a higher dose - Do metformin  when more stable  SIRS - Febrile, leukocytosis, tachycardic and tachypneic, this is most likely in the setting of her acute pancreatitis, will obtain blood cultures given fever 100.6, she will be started empirically on IV meropenem.  Hyponatremia - Improving with IV fluids  Hyperkalemia - As discussed with lab, this is most likely hemolyzed in the setting of her hyperlipemia   Hypertriglyceridemia - Resume statin, Zetia  and fenofibrate  -  she will benefit from a referral to lipid clinic.  Cussed with cardiology to initiate referral.  Persistent tachycardia -Presumably related to above -CTA chest negative for PE  Transaminitis - She is mildly elevated, CT significant for hepatitis steatosis  Hypokalemia Hypophosphatemia Hyponatremia - All being replaced.  DVT prophylaxis: Lovenox  Code Status: Full Family Communication: Discussed with husband at bedside, telemetry interpreter used [phone]  disposition:   Status is: Inpatient    Consultants:  GI   Subjective:  Reports nausea and epigastric pain much improved  Objective: Vitals:   11/02/23 2200 11/03/23 0000 11/03/23 0730 11/03/23 0753  BP: 109/66 109/68 110/75 104/65  Pulse: (!) 123 (!) 118 (!) 106 (!) 104  Resp: 19 19 16 20   Temp:  99.4 F (37.4 C) 98.8 F (37.1 C) 98.5 F (36.9 C)  TempSrc:   Oral Oral  SpO2: 95% 95% 96% 94%  Weight:  Height:        Intake/Output Summary (Last 24 hours) at 11/03/2023 1135 Last data filed at 11/03/2023 0100 Gross per 24 hour  Intake --  Output 200 ml  Net -200 ml   Filed Weights   11/01/23 0210  Weight: 67.1 kg    Examination:  Awake Alert, Oriented X 3, No new F.N  deficits, Normal affect Symmetrical Chest wall movement, Good air movement bilaterally, CTAB RRR,No Gallops,Rubs or new Murmurs, No Parasternal Heave +ve B.Sounds, Abd Soft, epigastric tenderness still present but improved no Cyanosis, Clubbing or edema, No new Rash or bruise        Data Reviewed: I have personally reviewed following labs and imaging studies  CBC: Recent Labs  Lab 11/01/23 0438 11/01/23 0925 11/02/23 1059 11/03/23 0607  WBC 15.0* 15.1* 9.5 8.2  HGB 15.0 15.4* 14.2 11.6*  HCT 40.4 43.1 39.9 32.4*  MCV 84.3 86.4 83.0 83.3  PLT 273 272 216 184    Basic Metabolic Panel: Recent Labs  Lab 11/01/23 0217 11/01/23 0925 11/02/23 0001 11/02/23 1059 11/03/23 0607 11/03/23 0630  NA 120*  --  130* 135 135  --   K 3.5  --  5.4* 3.4* 2.5*  --   CL 89*  --  105 107 106  --   CO2 17*  --  14* 19* 23  --   GLUCOSE 316*  --  254* 188* 104*  --   BUN 8  --  6 6 <5*  --   CREATININE 0.60 0.65 0.44 0.31* <0.30*  --   CALCIUM  8.4*  --  7.9* 8.5* 7.8*  --   MG  --   --   --   --   --  1.6*  PHOS  --   --   --   --   --  1.8*    GFR: CrCl cannot be calculated (This lab value cannot be used to calculate CrCl because it is not a number: <0.30).  Liver Function Tests: Recent Labs  Lab 11/01/23 0217 11/02/23 1059  AST 43* 18  ALT 58* 13  ALKPHOS 81 61  BILITOT 0.4 0.8  PROT 7.3 5.6*  ALBUMIN 3.7 2.3*    CBG: Recent Labs  Lab 11/03/23 0618 11/03/23 0730 11/03/23 0756 11/03/23 0902 11/03/23 1005  GLUCAP 113* 89 80 80 77     Recent Results (from the past 240 hours)  Culture, blood (Routine X 2) w Reflex to ID Panel     Status: None (Preliminary result)   Collection Time: 11/02/23 10:59 AM   Specimen: BLOOD RIGHT HAND  Result Value Ref Range Status   Specimen Description BLOOD RIGHT HAND  Final   Special Requests   Final    BOTTLES DRAWN AEROBIC AND ANAEROBIC Blood Culture adequate volume   Culture   Final    NO GROWTH < 24 HOURS Performed at [facility], [address] [place], [place] [address]    Report Status PENDING  Incomplete  Culture, blood (Routine X 2) w Reflex to ID Panel     Status: None (Preliminary result)   Collection Time: 11/02/23 11:02 AM   Specimen: BLOOD LEFT HAND  Result Value Ref Range Status   Specimen Description BLOOD LEFT HAND  Final   Special Requests   Final    BOTTLES DRAWN AEROBIC AND ANAEROBIC Blood Culture adequate volume   Culture   Final    NO GROWTH < 24 HOURS Performed at [facility]  Lab, [address] [place], [place] [address]    Report Status PENDING  Incomplete         Radiology Studies: CT Angio Chest Pulmonary Embolism (PE) W or WO Contrast Result Date: 11/01/2023 CLINICAL DATA:  Pulmonary embolism (PE) suspected, high prob. Chest pain, epigastric pain EXAM: CT ANGIOGRAPHY CHEST WITH CONTRAST TECHNIQUE: Multidetector CT imaging of the chest was performed using the standard protocol during bolus administration of intravenous contrast. Multiplanar CT image reconstructions and MIPs were obtained to evaluate the vascular anatomy. RADIATION DOSE REDUCTION: This exam was performed according to the departmental dose-optimization program which includes automated exposure control, adjustment of the mA and/or kV according to patient size and/or use of iterative reconstruction technique. CONTRAST:  75mL OMNIPAQUE  IOHEXOL  350 MG/ML SOLN COMPARISON:  None Available. FINDINGS: Cardiovascular: No filling defects in the pulmonary arteries to suggest pulmonary emboli. Heart is normal size. Aorta is normal caliber. Mediastinum/Nodes: No mediastinal, hilar, or axillary adenopathy. Trachea and esophagus are unremarkable. Thyroid unremarkable. Lungs/Pleura: Ground-glass and linear airspace opacities in the lower lobes, favor atelectasis. No effusions. Upper Abdomen: Fluid noted adjacent to the stomach, colon and spleen has increased since earlier abdominal CT. This likely reflects worsening pancreatitis.  Pancreas is not visualized on this chest CT. Musculoskeletal: No acute bony abnormality. Review of the MIP images confirms the above findings. IMPRESSION: No evidence of pulmonary embolus. Ground-glass and linear densities in the lower lobes, likely atelectasis. Increasing fluid seen in the upper abdomen adjacent to the stomach, spleen and colon concerning for worsening pancreatitis. Electronically Signed   By: Franky Crease M.D.   On: 11/01/2023 23:32        Scheduled Meds:  atorvastatin   40 mg Oral Daily   enoxaparin  (LOVENOX ) injection  40 mg Subcutaneous Q24H   fenofibrate   160 mg Oral Daily   insulin  aspart  0-15 Units Subcutaneous TID WC   insulin  aspart  0-5 Units Subcutaneous QHS   insulin  glargine-yfgn  10 Units Subcutaneous Daily   potassium chloride   60 mEq Oral Q6H   Continuous Infusions:  lactated ringers      magnesium  sulfate bolus IVPB     meropenem (MERREM) IV 200 mL/hr at 11/03/23 0540   potassium chloride  10 mEq (11/03/23 1004)   potassium PHOSPHATE IVPB (in mmol)       LOS: 2 days      Brayton Lye, MD [facility]   To contact the attending provider between 7A-7P or the covering provider during after hours 7P-7A, please log into the web site [email or web address] and access using universal [place] password for that web site. If you do not have the password, please call the hospital operator.  11/03/2023, 11:35 AM

## 2023-11-04 DIAGNOSIS — E782 Mixed hyperlipidemia: Secondary | ICD-10-CM

## 2023-11-04 DIAGNOSIS — Z91148 Patient's other noncompliance with medication regimen for other reason: Secondary | ICD-10-CM

## 2023-11-04 DIAGNOSIS — E781 Pure hyperglyceridemia: Secondary | ICD-10-CM

## 2023-11-04 DIAGNOSIS — K858 Other acute pancreatitis without necrosis or infection: Secondary | ICD-10-CM

## 2023-11-04 LAB — BASIC METABOLIC PANEL WITH GFR
Anion gap: 9 (ref 5–15)
BUN: <5 mg/dL — ABNORMAL LOW (ref 6–20)
CO2: 21 mmol/L — ABNORMAL LOW (ref 22–32)
Calcium: 8.5 mg/dL — ABNORMAL LOW (ref 8.9–10.3)
Chloride: 103 mmol/L (ref 98–111)
Creatinine, Ser: 0.38 mg/dL — ABNORMAL LOW (ref 0.44–1.00)
GFR, Estimated: >60 mL/min (ref >60)
Glucose, Bld: 178 mg/dL — ABNORMAL HIGH (ref 70–99)
Potassium: 4 mmol/L (ref 3.5–5.1)
Sodium: 133 mmol/L — ABNORMAL LOW (ref 135–145)

## 2023-11-04 LAB — GLUCOSE, CAPILLARY
Glucose-Capillary: 177 mg/dL — ABNORMAL HIGH (ref 70–99)
Glucose-Capillary: 206 mg/dL — ABNORMAL HIGH (ref 70–99)
Glucose-Capillary: 147 mg/dL — ABNORMAL HIGH (ref 70–99)
Glucose-Capillary: 177 mg/dL — ABNORMAL HIGH (ref 70–99)

## 2023-11-04 LAB — CBC
HCT: 34.3 % — ABNORMAL LOW (ref 36.0–46.0)
Hemoglobin: 11.6 g/dL — ABNORMAL LOW (ref 12.0–15.0)
MCH: 29.4 pg (ref 26.0–34.0)
MCHC: 33.8 g/dL (ref 30.0–36.0)
MCV: 87.1 fL (ref 80.0–100.0)
Platelets: 204 10*3/uL (ref 150–400)
RBC: 3.94 MIL/uL (ref 3.87–5.11)
RDW: 13 % (ref 11.5–15.5)
WBC: 6.8 10*3/uL (ref 4.0–10.5)
nRBC: 0 % (ref 0.0–0.2)

## 2023-11-04 LAB — PHOSPHORUS: Phosphorus: 1.9 mg/dL — ABNORMAL LOW (ref 2.5–4.6)

## 2023-11-04 LAB — MAGNESIUM: Magnesium: 1.9 mg/dL (ref 1.7–2.4)

## 2023-11-04 MED ORDER — K PHOS MONO-SOD PHOS DI & MONO 155-852-130 MG PO TABS
500.0000 mg | ORAL_TABLET | Freq: Four times a day (QID) | ORAL | Status: AC
  Administered 2023-11-04 – 2023-11-05 (×4): 500 mg via ORAL
  Filled 2023-11-04 (×4): qty 2

## 2023-11-04 MED ORDER — SODIUM PHOSPHATES 45 MMOLE/15ML IV SOLN
15.0000 mmol | Freq: Once | INTRAVENOUS | Status: AC
  Administered 2023-11-04: 15 mmol via INTRAVENOUS
  Filled 2023-11-04: qty 5

## 2023-11-04 MED ORDER — HYDROMORPHONE HCL 1 MG/ML IJ SOLN
0.5000 mg | INTRAMUSCULAR | Status: DC | PRN
  Administered 2023-11-04 – 2023-11-05 (×4): 0.5 mg via INTRAVENOUS
  Filled 2023-11-04 (×4): qty 0.5

## 2023-11-04 NOTE — Inpatient Diabetes Management (Addendum)
 Inpatient Diabetes Program Recommendations  AACE/ADA: New Consensus Statement on Inpatient Glycemic Control (2015)  Target Ranges:  Prepandial:   less than 140 mg/dL      Peak postprandial:   less than 180 mg/dL (1-2 hours)      Critically ill patients:  140 - 180 mg/dL   Lab Results  Component Value Date   GLUCAP 177 (H) 11/04/2023   HGBA1C 13.2 (H) 11/02/2023    Review of Glycemic Control  Latest Reference Range & Units 11/03/23 10:05 11/03/23 12:01 11/03/23 15:10 11/03/23 17:31 11/03/23 21:17 11/04/23 07:31  Glucose-Capillary 70 - 99 mg/dL 77 881 (H) 824 (H) 801 (H) 187 (H) 177 (H)   Diabetes history: DM 2 Outpatient Diabetes medications:  Glucotrol 10 mg bid Metformin  1000 mg bid Current orders for Inpatient glycemic control:  Novolog  0-15 units tid with meals and HS Novolog  70/30 mix 6 units bid  Inpatient Diabetes Program Recommendations:    Spoke at length with patient using in-person interpreter.       Discussed barriers to use of insulin  at home.  Patient answered that she had been afraid to take insulin  b/c she had family member warn her that it was dangerous.  I explained what insulin  is, how it works and why it is so important for her body to work properly. Discussed when to take insulin , how to store insulin , when to check blood sugars, goal blood sugars, and hypoglycemia signs, symptoms and treatment.  Patient was appreciative of information and states now I understand and I feel safe taking it now.  She has 3 unopened insulin  pens of Novolin  70/30 at home.  She states she does not have medication insurance and was able to purchase at [facility] for 42$.  Discussed that d/c paperwork would have specific instructions for use of insulin . She states she does not have a meter.  Will give her meter from [employer]- which is a [employer] meter. Patient states that she feels much better about taking insulin  and seems to understand why it is important. She also states that  she has pain in her feet at night.  Asked her to report pain to MD.  Also ordered LWWD booklet in Spanish.    Thanks,  Randall Bullocks, RN, BC-ADM Inpatient Diabetes Coordinator Pager [phone]  (8a-5p)

## 2023-11-04 NOTE — Plan of Care (Signed)

## 2023-11-04 NOTE — Progress Notes (Signed)
      Progress Note   Subjective  Doing better, states pain is improving. Tolerated low fat diet / full liquid this AM.   Objective   Vital signs in last 24 hours: Temp:  [97.9 F (36.6 C)-98.6 F (37 C)] 98.5 F (36.9 C) (06/27 0732) Pulse Rate:  [93-116] 105 (06/27 0732) Resp:  [15-35] 16 (06/27 0732) BP: (95-120)/(59-94) 120/94 (06/27 0732) SpO2:  [93 %-99 %] 98 % (06/27 0732) Last BM Date : 10/31/23 General:    hispanic female in NAD Neurologic:  Alert and oriented,  grossly normal neurologically. Psych:  Cooperative. Normal mood and affect.  Intake/Output from previous day: 06/26 0701 - 06/27 0700 In: 1656.4 [P.O.:360; I.V.:991.2; IV Piggyback:305.1] Out: -  Intake/Output this shift: Total I/O In: -  Out: 250 [Urine:250]  Lab Results: Recent Labs    11/02/23 1059 11/03/23 0607 11/04/23 0748  WBC 9.5 8.2 6.8  HGB 14.2 11.6* 11.6*  HCT 39.9 32.4* 34.3*  PLT 216 184 204   BMET Recent Labs    11/03/23 0607 11/03/23 1457 11/04/23 0748  NA 135 130* 133*  K 2.5* 3.7 4.0  CL 106 101 103  CO2 23 21* 21*  GLUCOSE 104* 200* 178*  BUN <5* <5* <5*  CREATININE <0.30* <0.30* 0.38*  CALCIUM  7.8* 7.8* 8.5*   LFT Recent Labs    11/02/23 1059  PROT 5.6*  ALBUMIN 2.3*  AST 18  ALT 13  ALKPHOS 61  BILITOT 0.8   PT/INR No results for input(s): LABPROT, INR in the last 72 hours.  Studies/Results: No results found.     Assessment / Plan:    33 y/o female here with the following:  Acute pancreatitis secondary to elevated triglycerides  Noncompliance with triglyceride medication as outpatient has led to recurrent pancreatitis. Now, following insulin  drip and fenofibrate , her levels are much better. She seems to be progressing from pancreatitis standpoint. Continue low fat diet.  She should be referred to the lipid clinic as outpatient for further management of this, hopefully with control of triglycerides she will not have any further episodes of  pancreatitis.  We will sign off for now, please call with questions / concerns in the interim.  Marcey Naval, MD [facility]

## 2023-11-04 NOTE — Progress Notes (Signed)
   11/04/23 1222  TOC Brief Assessment  Insurance and Status Reviewed (Uninsured)  Patient has primary care physician Yes Robertha, Rosaline SQUIBB, NP)  Home environment has been reviewed From home with Spouse  Prior level of function: independent  Prior/Current Home Services No current home services  Social Drivers of Health Review SDOH reviewed interventions complete (MATCH completed)  Readmission risk has been reviewed Yes (13%)  Transition of care needs no transition of care needs at this time   [facility] completed for uninsured patient. TOC will continue to follow patient for any additional discharge needs

## 2023-11-04 NOTE — Plan of Care (Signed)

## 2023-11-04 NOTE — TOC Progression Note (Signed)
 Transition of Care [facility]) - Progression Note    Patient Details  Name: Shelia Ford MRN: [id number] Date of Birth: [birth year]  Transition of Care [facility]) CM/SW Contact  Hendricks KANDICE Her, RN Phone Number: 11/04/2023, 12:20 PM  Clinical Narrative:    MATCH completed          Expected Discharge  Mand Services                                               Social Determinants of Health (SDOH) Interventions SDOH Screenings   Food Insecurity: No Food Insecurity (11/01/2023)  Housing: Low Risk  (11/01/2023)  Transportation Needs: No Transportation Needs (11/01/2023)  Utilities: Not At Risk (11/01/2023)  Depression (PHQ2-9): Low Risk  (02/03/2022)  Financial Resource Strain: Not on File (12/01/2021)   Received from [facility]  Physical Activity: Not on File (12/01/2021)   [facility]  Social Connections: Not on File (01/12/2023)   Received from [facility]  Stress: Not on File (12/01/2021)   Received from [facility]  Tobacco Use: Low Risk  (11/01/2023)    Readmission Risk Interventions     No data to display

## 2023-11-04 NOTE — TOC CM/SW Note (Signed)
 MATCH MEDICATION ASSISTANCE CARD Pharmacies please call [phone] for claim processing assistance.  Rx BIN: [id number] Rx Group: [id number] Rx PCN: [id number] Relationship Code: 1 Person Code: 01  Patient ID (MRN):  MOSES  [id number]        Patient Name: Shelia Ford   Patient DOB: 21-Jul-1990   Discharge Date: 11/05/2023  Expiration Date:11/12/2023 (must be filled within 7 days of discharge)   Dear Rosine have been approved to have the prescriptions written by your discharging physician filled through our [facility] (Medication Assistance Through [facility]) program. This program allows for a one-time (no refills) 34-day supply of selected medications for a low copay amount.  The copay is $3.00 per prescription. For instance, if you have one prescription, you will pay $3.00; for two prescriptions, you pay $6.00; for three prescriptions, you pay $9.00; and so on. Only certain pharmacies are participating in this program with [facility]. You will need to select one of the pharmacies from the attached lists and take your prescriptions, this letter, and your photo ID to one of the participating pharmacies.  We are excited that you are able to use the [facility] program to get your medications. These prescriptions must be filled within 7 days of hospital discharge or they will no longer be valid for the [facility] program. Should you have any problems with your prescriptions please contact your case management team member at [phone] for [place] [facility] or [phone] for [facility].  Thank you, [facility]    [facility] Pharmacies [place]. [facility], [facility], [facility]  [facility] [address] [place], [place] [address] [place], [place] [address], [place], [employer] [address], [address], [place] Other [facility] [address] [place], [place] [facility] [address] [facility] [id number], [place]

## 2023-11-04 NOTE — Progress Notes (Signed)
 PROGRESS NOTE    Shelia Ford  FMW:[id number] DOB: Mar 23, 1991 DOA: 11/01/2023 PCP: Celestia Rosaline SQUIBB, NP   Chief Complaint  Patient presents with   Abdominal Pain    Brief Narrative:    Shelia Ford is a 33 y.o. 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   Pt is a native [place] spe[place] and pre-occupied with her abdominal pain at the time of the exam. From what I can gather per Epic and brief interview with patient, she had pain in her belly for the past 3 days for which she took tylenol  1g BID without relief; as such, she presented to the ED for ongoing care. She endorses a previous episode of pancreatitis 4 years ago, and denies alcohol use.   In the ED, pt febrile, hypertensive, tachycardic and tachypneic on RA. Labs notable for Na 120, Cr 0.6, lipase 989, triglycerides >5000, WBC 15, and glucose 316. CT abd showed acute pancreatitis. Pt admitted to medicine for ongoing care.    Assessment & Plan:   Principal Problem:   Acute pancreatitis Active Problems:   Acute pancreatitis due to systemic disease   Hyponatremia   Leukocytosis   Elevated triglycerides with high cholesterol  Hypertriglyceridemia Acute pancreatitis - Patient with history of acute pancreatitis in the past.  For hyperlipidemia - Lipase level significantly elevated, significantly inflamed pancreas on imaging with surrounding duodenum and stomach. - Continue with aggressive IV fluid hydration. -Continue with as needed pain and nausea medications.  Improving, decreased pain medicine. -Reports she is feeling better today, will start on clear liquid diet. -Empirically on IV meropenem  given leukocytosis and fever 100.6, but appears much improved, will DC IV antibiotics.  -GI input greatly appreciated. - Aminal pain has improved, as well nausea has improved, tolerating soft diet.   -back on  statin, Zetia  and  fenofibrate  for hypertriglyceridemia  Diabetes mellitus, type II, poorly controlled with hyperglycemia Early DKA - Workup significant for elevated BHA, ketonuria, bicarb of 14 - Insulin  drip initially - A1c is currently elevated at 13.2 - Will need to discharge on insulin , insulin  70/30 6 units twice daily, so far good control, she has been counseled extensively by diabetic coordinator multiple times.   - metformin  when more stable  SIRS - Febrile, leukocytosis, tachycardic and tachypneic, this is most likely in the setting of her acute pancreatitis, will obtain blood cultures given fever 100.6, started empirically on IV meropenem . - Septic workup is negative, will DC IV antibiotics  Hyponatremia - Improving with IV fluids  Hyperkalemia - As discussed with lab, this is most likely hemolyzed in the setting of her hyperlipemia   Hypertriglyceridemia - Resume statin, Zetia  and fenofibrate  -  she will benefit from a referral to lipid clinic.  Discussed with cardiology coordinator, she will arrange for follow-up with lipid clinic.   Persistent tachycardia -Presumably related to above -CTA chest negative for PE  Transaminitis - She is mildly elevated, CT significant for hepatitis steatosis  Hypokalemia Hypophosphatemia Hyponatremia - All being replaced.  Continue to monitor  DVT prophylaxis: Lovenox  Code Status: Full Family Communication:  none at bedside disposition:   Status is: Inpatient    Consultants:  GI   Subjective:  No further nausea, abdominal pain much improved, she had a good BM yesterday.  Objective: Vitals:   11/04/23 0329 11/04/23 0400 11/04/23 0500 11/04/23 0732  BP:  118/82 107/74 (!) 120/94  Pulse:  96 93 (!) 105  Resp:  15   16  Temp: 98.6 F (37 C)   98.5 F (36.9 C)  TempSrc: Oral   Oral  SpO2:  93% 95% 98%  Weight:      Height:        Intake/Output Summary (Last 24 hours) at 11/04/2023 1133 Last data filed at 11/04/2023 9266 Gross per  24 hour  Intake 1656.36 ml  Output 250 ml  Net 1406.36 ml   Filed Weights   11/01/23 0210  Weight: 67.1 kg    Examination:  Awake Alert, Oriented X 3, No new F.N deficits, Normal affect Symmetrical Chest wall movement, Good air movement bilaterally, CTAB +ve B.Sounds, Abd Soft, No tenderness, No rebound - guarding or rigidity. No Cyanosis, Clubbing or edema, No new Rash or bruise         Data Reviewed: I have personally reviewed following labs and imaging studies  CBC: Recent Labs  Lab 11/01/23 0438 11/01/23 0925 11/02/23 1059 11/03/23 0607 11/04/23 0748  WBC 15.0* 15.1* 9.5 8.2 6.8  HGB 15.0 15.4* 14.2 11.6* 11.6*  HCT 40.4 43.1 39.9 32.4* 34.3*  MCV 84.3 86.4 83.0 83.3 87.1  PLT 273 272 216 184 204    Basic Metabolic Panel: Recent Labs  Lab 11/02/23 0001 11/02/23 1059 11/03/23 0607 11/03/23 0630 11/03/23 1457 11/04/23 0748  NA 130* 135 135  --  130* 133*  K 5.4* 3.4* 2.5*  --  3.7 4.0  CL 105 107 106  --  101 103  CO2 14* 19* 23  --  21* 21*  GLUCOSE 254* 188* 104*  --  200* 178*  BUN 6 6 <5*  --  <5* <5*  CREATININE 0.44 0.31* <0.30*  --  <0.30* 0.38*  CALCIUM  7.9* 8.5* 7.8*  --  7.8* 8.5*  MG  --   --   --  1.6*  --  1.9  PHOS  --   --   --  1.8*  --  1.9*    GFR: Estimated Creatinine Clearance: 71.8 mL/min (A) (by C-G formula based on SCr of 0.38 mg/dL (L)).  Liver Function Tests: Recent Labs  Lab 11/01/23 0217 11/02/23 1059  AST 43* 18  ALT 58* 13  ALKPHOS 81 61  BILITOT 0.4 0.8  PROT 7.3 5.6*  ALBUMIN 3.7 2.3*    CBG: Recent Labs  Lab 11/03/23 1201 11/03/23 1510 11/03/23 1731 11/03/23 2117 11/04/23 0731  GLUCAP 118* 175* 198* 187* 177*     Recent Results (from the past 240 hours)  Culture, blood (Routine X 2) w Reflex to ID Panel     Status: None (Preliminary result)   Collection Time: 11/02/23 10:59 AM   Specimen: BLOOD RIGHT HAND  Result Value Ref Range Status   Specimen Description BLOOD RIGHT HAND  Final    Special Requests   Final    BOTTLES DRAWN AEROBIC AND ANAEROBIC Blood Culture adequate volume   Culture   Final    NO GROWTH 2 DAYS Performed at [facility], [address] [place], [place] [address]    Report Status PENDING  Incomplete  Culture, blood (Routine X 2) w Reflex to ID Panel     Status: None (Preliminary result)   Collection Time: 11/02/23 11:02 AM   Specimen: BLOOD LEFT HAND  Result Value Ref Range Status   Specimen Description BLOOD LEFT HAND  Final   Special Requests   Final    BOTTLES DRAWN AEROBIC AND ANAEROBIC Blood Culture adequate volume   Culture  Final    NO GROWTH 2 DAYS Performed at [facility], [address] [place], [place] [address]    Report Status PENDING  Incomplete         Radiology Studies: No results found.       Scheduled Meds:  atorvastatin   40 mg Oral Daily   enoxaparin  (LOVENOX ) injection  40 mg Subcutaneous Q24H   fenofibrate   160 mg Oral Daily   insulin  aspart  0-15 Units Subcutaneous TID WC   insulin  aspart  0-5 Units Subcutaneous QHS   insulin  aspart protamine- aspart  6 Units Subcutaneous BID WC   polyethylene glycol  17 g Oral Daily   Continuous Infusions:  sodium PHOSPHATE  IVPB (in mmol)       LOS: 3 days      Brayton Lye, MD [facility]   To contact the attending provider between 7A-7P or the covering provider during after hours 7P-7A, please log into the web site [email or web address] and access using universal [place] password for that web site. If you do not have the password, please call the hospital operator.  11/04/2023, 11:33 AM

## 2023-11-05 ENCOUNTER — Other Ambulatory Visit (HOSPITAL_COMMUNITY): Payer: Self-pay

## 2023-11-05 LAB — BASIC METABOLIC PANEL WITH GFR
Anion gap: 10 (ref 5–15)
BUN: 6 mg/dL (ref 6–20)
CO2: 24 mmol/L (ref 22–32)
Calcium: 8.8 mg/dL — ABNORMAL LOW (ref 8.9–10.3)
Chloride: 103 mmol/L (ref 98–111)
Creatinine, Ser: 0.39 mg/dL — ABNORMAL LOW (ref 0.44–1.00)
GFR, Estimated: >60 mL/min (ref >60)
Glucose, Bld: 207 mg/dL — ABNORMAL HIGH (ref 70–99)
Potassium: 3.8 mmol/L (ref 3.5–5.1)
Sodium: 137 mmol/L (ref 135–145)

## 2023-11-05 LAB — CBC
HCT: 33 % — ABNORMAL LOW (ref 36.0–46.0)
Hemoglobin: 11.1 g/dL — ABNORMAL LOW (ref 12.0–15.0)
MCH: 28.9 pg (ref 26.0–34.0)
MCHC: 33.6 g/dL (ref 30.0–36.0)
MCV: 85.9 fL (ref 80.0–100.0)
Platelets: 235 10*3/uL (ref 150–400)
RBC: 3.84 MIL/uL — ABNORMAL LOW (ref 3.87–5.11)
RDW: 12.5 % (ref 11.5–15.5)
WBC: 5.7 10*3/uL (ref 4.0–10.5)
nRBC: 0 % (ref 0.0–0.2)

## 2023-11-05 LAB — MAGNESIUM: Magnesium: 1.8 mg/dL (ref 1.7–2.4)

## 2023-11-05 LAB — PHOSPHORUS: Phosphorus: 3.4 mg/dL (ref 2.5–4.6)

## 2023-11-05 LAB — GLUCOSE, CAPILLARY
Glucose-Capillary: 216 mg/dL — ABNORMAL HIGH (ref 70–99)
Glucose-Capillary: 189 mg/dL — ABNORMAL HIGH (ref 70–99)

## 2023-11-05 MED ORDER — INSULIN PEN NEEDLE 31G X 8 MM MISC
1.0000 | Freq: Three times a day (TID) | 0 refills | Status: AC
  Filled 2023-11-05: qty 100, 33d supply, fill #0

## 2023-11-05 MED ORDER — INSULIN ASPART PROT & ASPART (70-30 MIX) 100 UNIT/ML PEN
6.0000 [IU] | PEN_INJECTOR | Freq: Two times a day (BID) | SUBCUTANEOUS | 0 refills | Status: AC
  Filled 2023-11-05: qty 3, 25d supply, fill #0

## 2023-11-05 MED ORDER — FENOFIBRATE 145 MG PO TABS
145.0000 mg | ORAL_TABLET | Freq: Every day | ORAL | 0 refills | Status: AC
  Filled 2023-11-05: qty 30, 30d supply, fill #0

## 2023-11-05 MED ORDER — ATORVASTATIN CALCIUM 40 MG PO TABS
40.0000 mg | ORAL_TABLET | Freq: Every day | ORAL | 0 refills | Status: AC
  Filled 2023-11-05: qty 30, 30d supply, fill #0

## 2023-11-05 MED ORDER — METFORMIN HCL 1000 MG PO TABS
1000.0000 mg | ORAL_TABLET | Freq: Two times a day (BID) | ORAL | 0 refills | Status: AC
  Filled 2023-11-05: qty 60, 30d supply, fill #0

## 2023-11-05 NOTE — Discharge Summary (Signed)
 Physician Discharge Summary  [address] Shelia Ford FMW:[id number] DOB: 10/29/1990 DOA: 11/01/2023  PCP: Celestia Rosaline SQUIBB, NP  Admit date: 11/01/2023 Discharge date: 11/05/2023  Admitted From: (Home) Disposition:  (Home)  Recommendations for Outpatient Follow-up:  Follow up with PCP in 1-2 weeks Please obtain BMP/CBC in one week Follow her up with panel and triglyceride levels, ambulatory referral has been made to lipid clinic.   Diet recommendation: Heart Healthy / Carb Modified   Brief/Interim Summary: Shelia Ford is a 33 y.o. 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   Pt is a native [place] and pre-occupied with her abdominal pain at the time of the exam. From what I can gather per Epic and brief interview with patient, she had pain in her belly for the past 3 days for which she took tylenol  1g BID without relief; as such, she presented to the ED for ongoing care. She endorses a previous episode of pancreatitis 4 years ago, and denies alcohol use.   In the ED, pt febrile, hypertensive, tachycardic and tachypneic on RA. Labs notable for Na 120, Cr 0.6, lipase 989, triglycerides >5000, WBC 15, and glucose 316. CT abd showed acute pancreatitis. Pt admitted to medicine for ongoing care.  She was started on insulin  drip, where her triglyceride has much improved, GI has been consulted, pancreatitis has improved as well, she was started on insulin , please see discussion below.     Hypertriglyceridemia Acute pancreatitis - Patient with history of acute pancreatitis in the past.  For hyperlipidemia - Lipase level significantly elevated, significantly inflamed pancreas on imaging with surrounding duodenum and stomach. - Continue with aggressive IV fluid hydration. -Continue with as needed pain and nausea medications.  Improving, decreased pain medicine. -Reports she is feeling better today, will  start on clear liquid diet. -Empirically on IV meropenem  given leukocytosis and fever 100.6, but appears much improved, will DC IV antibiotics.  -GI input greatly appreciated. - Aminal pain has improved, as well nausea has improved, tolerating soft diet.   -back on  statin, Zetia  and fenofibrate  for hypertriglyceridemia   Diabetes mellitus, type II, poorly controlled with hyperglycemia Early DKA - Workup significant for elevated BHA, ketonuria, bicarb of 14 - Insulin  drip initially, she was transitioned to subcu insulin . - A1c is currently elevated at 13.2 - Will need to discharge on insulin , insulin  70/30,  6 units twice daily, so far good control, she has been counseled extensively by diabetic coordinator multiple times.  She has her insulin  dispensed at time of discharge, will resume metformin  on discharge as well, glipizide has been discontinued.   SIRS due to acute pancreatitis, no infection - Febrile, leukocytosis, tachycardic and tachypneic, this is most likely in the setting of her acute pancreatitis, will obtain blood cultures given fever 100.6, started empirically on IV meropenem .  Has been discontinued after 48 hours has no evidence of infection. - Septic workup is negative,    Hyponatremia - I resolved with IV fluids   Hyperkalemia - As discussed with lab, this is most likely hemolyzed in the setting of her hyperlipemia, repeat labs were was actually low where she required supplements   Hypertriglyceridemia - Resume statin and fenofibrate  -  she will benefit from a referral to lipid clinic.  Discussed with cardiology coordinator, she will arrange for follow-up with lipid clinic at [facility] with Dr. Ozell, referral has been sent  Persistent tachycardia -Presumably related to above -CTA chest negative  for PE -This has resolved with appropriate hydration, pain control and improvement of her acute pancreatitis   Transaminitis - She is mildly elevated, CT  significant for hepatitis steatosis   Hypokalemia Hypophosphatemia Hyponatremia - All being replaced.  Normalized at time of discharge  Discharge Diagnoses:  Principal Problem:   Acute pancreatitis Active Problems:   Acute pancreatitis due to systemic disease   Hyponatremia   Leukocytosis   Elevated triglycerides with high cholesterol    Discharge Instructions  Discharge Instructions     AMB Referral to Advanced Lipid Disorders Clinic   Complete by: As directed    Reason for referral: Patients with Triglycerides >500 mg/dL   Internal Lipid Clinic Referral Scheduling  Internal lipid clinic referrals are providers within [facility], who wish to refer established patients for routine management (help in starting PCSK9 inhibitor therapy) or advanced therapies.  Internal MD referral criteria:              1. All patients with LDL>190 mg/dL  2. All patients with Triglycerides >500 mg/dL  3. Patients with suspected or confirmed heterozygous familial hyperlipidemia (HeFH) or homozygous familial hyperlipidemia (HoFH)  4. Patients with family history of suspicious for genetic dyslipidemia desiring genetic testing  5. Patients refractory to standard guideline based therapy  6. Patients with statin intolerance (failed 2 statins, one of which must be a high potency statin)  7. Patients who the provider desires to be seen by MD   Internal PharmD referral criteria:   1. Follow-up patients for medication management  2. Follow-up for compliance monitoring  3. Patients for drug education  4. Patients with statin intolerance  5. PCSK9 inhibitor education and prior authorization approvals  6. Patients with triglycerides <500 mg/dL  External Lipid Clinic Referral  External lipid clinic referrals are for providers outside of [facility], considered new clinic patients - automatically routed to MD schedule   Diet - low sodium heart healthy   Complete by: As directed    Discharge  instructions   Complete by: As directed    Follow with Primary MD Celestia Rosaline SQUIBB, NP in 7 days   Get CBC, CMP, checked  by Primary MD next visit.    Activity: As tolerated with Full fall precautions use walker/cane & assistance as needed   Disposition Home    Diet: Carb modified   On your next visit with your primary care physician please Get Medicines reviewed and adjusted.   Please request your Prim.MD to go over all Hospital Tests and Procedure/Radiological results at the follow up, please get all Hospital records sent to your Prim MD by signing hospital release before you go home.   If you experience worsening of your admission symptoms, develop shortness of breath, life threatening emergency, suicidal or homicidal thoughts you must seek medical attention immediately by calling 911 or calling your MD immediately  if symptoms less severe.  You Must read complete instructions/literature along with all the possible adverse reactions/side effects for all the Medicines you take and that have been prescribed to you. Take any new Medicines after you have completely understood and accpet all the possible adverse reactions/side effects.   Do not drive, operating heavy machinery, perform activities at heights, swimming or participation in water activities or provide baby sitting services if your were admitted for syncope or siezures until you have seen by Primary MD or a Neurologist and advised to do so again.  Do not drive when taking Pain medications.    Do not  take more than prescribed Pain, Sleep and Anxiety Medications  Special Instructions: If you have smoked or chewed Tobacco  in the last 2 yrs please stop smoking, stop any regular Alcohol  and or any Recreational drug use.  Wear Seat belts while driving.   Please note  You were cared for by a hospitalist during your hospital stay. If you have any questions about your discharge medications or the care you received while  you were in the hospital after you are discharged, you can call the unit and asked to speak with the hospitalist on call if the hospitalist that took care of you is not available. Once you are discharged, your primary care physician will handle any further medical issues. Please note that NO REFILLS for any discharge medications will be authorized once you are discharged, as it is imperative that you return to your primary care physician (or establish a relationship with a primary care physician if you do not have one) for your aftercare needs so that they can reassess your need for medications and monitor your lab values.   Increase activity slowly   Complete by: As directed       Allergies as of 11/05/2023   No Known Allergies      Medication List     STOP taking these medications    glipiZIDE 10 MG tablet Commonly known as: GLUCOTROL       TAKE these medications    acetaminophen  650 MG CR tablet Commonly known as: Tylenol  8 Hour Take 1 tablet (650 mg total) by mouth every 8 (eight) hours as needed for pain.   atorvastatin  40 MG tablet Commonly known as: LIPITOR  Take 1 tablet (40 mg total) by mouth daily. for cholesterol.   fenofibrate  145 MG tablet Commonly known as: TRICOR  Take 1 tablet (145 mg total) by mouth daily. for cholesterol.   insulin  aspart protamine - aspart (70-30) 100 UNIT/ML FlexPen Commonly known as: NOVOLOG  70/30 MIX Inject 6 Units into the skin 2 (two) times daily with a meal.   metFORMIN  1000 MG tablet Commonly known as: GLUCOPHAGE  Take 1 tablet (1,000 mg total) by mouth 2 (two) times daily with a meal.   Pen Needles 31G X 5 MM Misc 1 each by Does not apply route 3 (three) times daily. May dispense any manufacturer covered by patient's insurance.        No Known Allergies  Consultations: Gastroenterology   Procedures/Studies: CT Angio Chest Pulmonary Embolism (PE) W or WO Contrast Result Date: 11/01/2023 CLINICAL DATA:  Pulmonary  embolism (PE) suspected, high prob. Chest pain, epigastric pain EXAM: CT ANGIOGRAPHY CHEST WITH CONTRAST TECHNIQUE: Multidetector CT imaging of the chest was performed using the standard protocol during bolus administration of intravenous contrast. Multiplanar CT image reconstructions and MIPs were obtained to evaluate the vascular anatomy. RADIATION DOSE REDUCTION: This exam was performed according to the departmental dose-optimization program which includes automated exposure control, adjustment of the mA and/or kV according to patient size and/or use of iterative reconstruction technique. CONTRAST:  75mL OMNIPAQUE  IOHEXOL  350 MG/ML SOLN COMPARISON:  None Available. FINDINGS: Cardiovascular: No filling defects in the pulmonary arteries to suggest pulmonary emboli. Heart is normal size. Aorta is normal caliber. Mediastinum/Nodes: No mediastinal, hilar, or axillary adenopathy. Trachea and esophagus are unremarkable. Thyroid unremarkable. Lungs/Pleura: Ground-glass and linear airspace opacities in the lower lobes, favor atelectasis. No effusions. Upper Abdomen: Fluid noted adjacent to the stomach, colon and spleen has increased since earlier abdominal CT. This likely reflects worsening pancreatitis.  Pancreas is not visualized on this chest CT. Musculoskeletal: No acute bony abnormality. Review of the MIP images confirms the above findings. IMPRESSION: No evidence of pulmonary embolus. Ground-glass and linear densities in the lower lobes, likely atelectasis. Increasing fluid seen in the upper abdomen adjacent to the stomach, spleen and colon concerning for worsening pancreatitis. Electronically Signed   By: Franky Crease M.D.   On: 11/01/2023 23:32   CT ABDOMEN PELVIS W CONTRAST Result Date: 11/01/2023 EXAM: CT ABDOMEN AND PELVIS WITH CONTRAST 11/01/2023 05:48:37 AM TECHNIQUE: CT of the abdomen and pelvis was performed with the administration of intravenous contrast. Multiplanar reformatted images are provided for  review. Automated exposure control, iterative reconstruction, and/or weight based adjustment of the mA/kV was utilized to reduce the radiation dose to as low as reasonably achievable. COMPARISON: CT of the abdomen and pelvis 01/07/2022. CLINICAL HISTORY: Pancreatitis, acute, severe. Pt complaining of nausea and vomiting since 4 pm. Has pain in the epigastric area. FINDINGS: LOWER CHEST: No acute abnormality. LIVER: Mild fatty infiltration of the liver is present. No discrete lesions are present. GALLBLADDER AND BILE DUCTS: Gallbladder is unremarkable. No biliary ductal dilatation. SPLEEN: No acute abnormality. PANCREAS: Diffuse inflammatory changes are present about the head and proximal body of the pancreas. The pancreas enhances throughout. No focal mass lesion or cyst formation is present. Secondary inflammatory changes surround the distal stomach and duodenum. ADRENAL GLANDS: No acute abnormality. KIDNEYS, URETERS AND BLADDER: No stones in the kidneys or ureters. No hydronephrosis. No perinephric or periureteral stranding. Moderate distension of the urinary bladder is present. The bladder measures 15 cm cephalocaudad. GI AND BOWEL: Stomach demonstrates no acute abnormality. There is no bowel obstruction. No bowel wall thickening. PERITONEUM AND RETROPERITONEUM: No ascites. No free air. VASCULATURE: Aorta is normal in caliber. LYMPH NODES: No lymphadenopathy. REPRODUCTIVE ORGANS: No acute abnormality. BONES AND SOFT TISSUES: No acute osseous abnormality. No focal soft tissue abnormality. IMPRESSION: 1. Acute pancreatitis with diffuse inflammatory changes about the head and proximal body of the pancreas, as well as secondary inflammatory changes surrounding the distal stomach and duodenum. No focal mass lesion or cyst formation. 2. Mild hepatic steatosis without discrete lesions. 3. Moderate distension of the urinary bladder, measuring 15 cm cephalocaudad. Electronically signed by: Lonni Necessary MD  11/01/2023 06:22 AM EDT RP Workstation: [id number]      Subjective:  No significant events overnight, she denies any complaints currently, no nausea, no vomiting, abdominal pain has resolved  Telemetry interpreter # [id number]  Discharge Exam: Vitals:   11/05/23 0029 11/05/23 0527  BP: 114/82 (!) 133/93  Pulse: 89 96  Resp: 19 18  Temp: 98.2 F (36.8 C) 98.3 F (36.8 C)  SpO2: 97% 98%   Vitals:   11/04/23 1632 11/04/23 2149 11/05/23 0029 11/05/23 0527  BP: 114/86  114/82 (!) 133/93  Pulse:   89 96  Resp:   19 18  Temp: 98.2 F (36.8 C) 98.4 F (36.9 C) 98.2 F (36.8 C) 98.3 F (36.8 C)  TempSrc: Oral Oral Oral Oral  SpO2:   97% 98%  Weight:      Height:        General: Pt is alert, awake, not in acute distress Abdominal: Soft, NT, ND, bowel sounds + Extremities: no edema, no cyanosis    The results of significant diagnostics from this hospitalization (including imaging, microbiology, ancillary and laboratory) are listed below for reference.     Microbiology: Recent Results (from the past 240 hours)  Culture, blood (Routine X 2)  w Reflex to ID Panel     Status: None (Preliminary result)   Collection Time: 11/02/23 10:59 AM   Specimen: BLOOD RIGHT HAND  Result Value Ref Range Status   Specimen Description BLOOD RIGHT HAND  Final   Special Requests   Final    BOTTLES DRAWN AEROBIC AND ANAEROBIC Blood Culture adequate volume   Culture   Final    NO GROWTH 3 DAYS Performed at [facility], [address] [place], [place] [address]    Report Status PENDING  Incomplete  Culture, blood (Routine X 2) w Reflex to ID Panel     Status: None (Preliminary result)   Collection Time: 11/02/23 11:02 AM   Specimen: BLOOD LEFT HAND  Result Value Ref Range Status   Specimen Description BLOOD LEFT HAND  Final   Special Requests   Final    BOTTLES DRAWN AEROBIC AND ANAEROBIC Blood Culture adequate volume   Culture   Final    NO GROWTH 3 DAYS Performed at [facility], [address], [place], [place] [address]    Report Status PENDING  Incomplete     Labs: BNP (last 3 results) No results for input(s): BNP in the last 8760 hours. Basic Metabolic Panel: Recent Labs  Lab 11/02/23 0001 11/02/23 1059 11/03/23 0607 11/03/23 0630 11/03/23 1457 11/04/23 0748  NA 130* 135 135  --  130* 133*  K 5.4* 3.4* 2.5*  --  3.7 4.0  CL 105 107 106  --  101 103  CO2 14* 19* 23  --  21* 21*  GLUCOSE 254* 188* 104*  --  200* 178*  BUN 6 6 <5*  --  <5* <5*  CREATININE 0.44 0.31* <0.30*  --  <0.30* 0.38*  CALCIUM  7.9* 8.5* 7.8*  --  7.8* 8.5*  MG  --   --   --  1.6*  --  1.9  PHOS  --   --   --  1.8*  --  1.9*   Liver Function Tests: Recent Labs  Lab 11/01/23 0217 11/02/23 1059  AST 43* 18  ALT 58* 13  ALKPHOS 81 61  BILITOT 0.4 0.8  PROT 7.3 5.6*  ALBUMIN 3.7 2.3*   Recent Labs  Lab 11/01/23 0217 11/02/23 1059  LIPASE 989* 382*   No results for input(s): AMMONIA in the last 168 hours. CBC: Recent Labs  Lab 11/01/23 0925 11/02/23 1059 11/03/23 0607 11/04/23 0748 11/05/23 0759  WBC 15.1* 9.5 8.2 6.8 5.7  HGB 15.4* 14.2 11.6* 11.6* 11.1*  HCT 43.1 39.9 32.4* 34.3* 33.0*  MCV 86.4 83.0 83.3 87.1 85.9  PLT 272 216 184 204 235   Cardiac Enzymes: No results for input(s): CKTOTAL, CKMB, CKMBINDEX, TROPONINI in the last 168 hours. BNP: Invalid input(s): POCBNP CBG: Recent Labs  Lab 11/04/23 0731 11/04/23 1149 11/04/23 1637 11/04/23 2140 11/05/23 0825  GLUCAP 177* 206* 147* 177* 216*   D-Dimer No results for input(s): DDIMER in the last 72 hours. Hgb A1c No results for input(s): HGBA1C in the last 72 hours. Lipid Profile Recent Labs    11/02/23 1834 11/03/23 0607  TRIG 477* 396*   Thyroid function studies Recent Labs    11/02/23 1059  TSH 1.268   Anemia work up No results for input(s): VITAMINB12, FOLATE, FERRITIN, TIBC, IRON, RETICCTPCT in the last 72 hours. Urinalysis     Component Value Date/Time   COLORURINE YELLOW 11/01/2023 0217   APPEARANCEUR HAZY (A) 11/01/2023 0217   LABSPEC  1.036 (H) 11/01/2023 0217   PHURINE 5.0 11/01/2023 0217   GLUCOSEU >=500 (A) 11/01/2023 0217   HGBUR NEGATIVE 11/01/2023 0217   BILIRUBINUR NEGATIVE 11/01/2023 0217   KETONESUR 80 (A) 11/01/2023 0217   PROTEINUR 100 (A) 11/01/2023 0217   NITRITE NEGATIVE 11/01/2023 0217   LEUKOCYTESUR NEGATIVE 11/01/2023 0217   Sepsis Labs Recent Labs  Lab 11/02/23 1059 11/03/23 0607 11/04/23 0748 11/05/23 0759  WBC 9.5 8.2 6.8 5.7   Microbiology Recent Results (from the past 240 hours)  Culture, blood (Routine X 2) w Reflex to ID Panel     Status: None (Preliminary result)   Collection Time: 11/02/23 10:59 AM   Specimen: BLOOD RIGHT HAND  Result Value Ref Range Status   Specimen Description BLOOD RIGHT HAND  Final   Special Requests   Final    BOTTLES DRAWN AEROBIC AND ANAEROBIC Blood Culture adequate volume   Culture   Final    NO GROWTH 3 DAYS Performed at [facility], [address], [place], [place] [address]    Report Status PENDING  Incomplete  Culture, blood (Routine X 2) w Reflex to ID Panel     Status: None (Preliminary result)   Collection Time: 11/02/23 11:02 AM   Specimen: BLOOD LEFT HAND  Result Value Ref Range Status   Specimen Description BLOOD LEFT HAND  Final   Special Requests   Final    BOTTLES DRAWN AEROBIC AND ANAEROBIC Blood Culture adequate volume   Culture   Final    NO GROWTH 3 DAYS Performed at [facility], [address], [place], [place] [address]    Report Status PENDING  Incomplete     Time coordinating discharge: Over 30 minutes  SIGNED:   Brayton Lye, MD  [facility] 11/05/2023, 9:34 AM Pager   If 7PM-7AM, please contact night-coverage [email or web address] Password [email or web address]

## 2023-11-05 NOTE — Plan of Care (Signed)
  Problem: Education: Goal: Knowledge of General Education information will improve Description: Including pain rating scale, medication(s)/side effects and non-pharmacologic comfort measures Outcome: Progressing   Problem: Clinical Measurements: Goal: Will remain free from infection Outcome: Progressing Goal: Diagnostic test results will improve Outcome: Progressing   Problem: Activity: Goal: Risk for activity intolerance will decrease Outcome: Progressing   Problem: Nutrition: Goal: Adequate nutrition will be maintained Outcome: Progressing

## 2023-11-05 NOTE — Discharge Instructions (Signed)
 Follow with Primary MD Celestia Rosaline SQUIBB, NP in 7 days   Get CBC, CMP, checked  by Primary MD next visit.    Activity: As tolerated with Full fall precautions use walker/cane & assistance as needed   Disposition Home    Diet: Carb modified   On your next visit with your primary care physician please Get Medicines reviewed and adjusted.   Please request your Prim.MD to go over all Hospital Tests and Procedure/Radiological results at the follow up, please get all Hospital records sent to your Prim MD by signing hospital release before you go home.   If you experience worsening of your admission symptoms, develop shortness of breath, life threatening emergency, suicidal or homicidal thoughts you must seek medical attention immediately by calling 911 or calling your MD immediately  if symptoms less severe.  You Must read complete instructions/literature along with all the possible adverse reactions/side effects for all the Medicines you take and that have been prescribed to you. Take any new Medicines after you have completely understood and accpet all the possible adverse reactions/side effects.   Do not drive, operating heavy machinery, perform activities at heights, swimming or participation in water activities or provide baby sitting services if your were admitted for syncope or siezures until you have seen by Primary MD or a Neurologist and advised to do so again.  Do not drive when taking Pain medications.    Do not take more than prescribed Pain, Sleep and Anxiety Medications  Special Instructions: If you have smoked or chewed Tobacco  in the last 2 yrs please stop smoking, stop any regular Alcohol  and or any Recreational drug use.  Wear Seat belts while driving.   Please note  You were cared for by a hospitalist during your hospital stay. If you have any questions about your discharge medications or the care you received while you were in the hospital after you are  discharged, you can call the unit and asked to speak with the hospitalist on call if the hospitalist that took care of you is not available. Once you are discharged, your primary care physician will handle any further medical issues. Please note that NO REFILLS for any discharge medications will be authorized once you are discharged, as it is imperative that you return to your primary care physician (or establish a relationship with a primary care physician if you do not have one) for your aftercare needs so that they can reassess your need for medications and monitor your lab values.

## 2023-11-07 ENCOUNTER — Other Ambulatory Visit (HOSPITAL_COMMUNITY): Payer: Self-pay

## 2023-11-07 LAB — CULTURE, BLOOD (ROUTINE X 2)
Special Requests: ADEQUATE
Special Requests: ADEQUATE

## 2023-11-14 ENCOUNTER — Other Ambulatory Visit (HOSPITAL_COMMUNITY): Payer: Self-pay

## 2023-12-07 ENCOUNTER — Encounter (HOSPITAL_BASED_OUTPATIENT_CLINIC_OR_DEPARTMENT_OTHER): Payer: Self-pay

## 2024-02-23 ENCOUNTER — Other Ambulatory Visit (HOSPITAL_COMMUNITY): Payer: Self-pay
